# Patient Record
Sex: Female | Born: 1935 | Race: Black or African American | Hispanic: No | Marital: Married | Smoking: Never smoker
Health system: Southern US, Community
[De-identification: ages and names within clinical notes are randomized; demographics above are authoritative.]

## PROBLEM LIST (undated history)

## (undated) DIAGNOSIS — J45909 Unspecified asthma, uncomplicated: Secondary | ICD-10-CM

## (undated) DIAGNOSIS — I10 Essential (primary) hypertension: Secondary | ICD-10-CM

## (undated) DIAGNOSIS — H409 Unspecified glaucoma: Secondary | ICD-10-CM

## (undated) DIAGNOSIS — E785 Hyperlipidemia, unspecified: Secondary | ICD-10-CM

## (undated) DIAGNOSIS — E119 Type 2 diabetes mellitus without complications: Secondary | ICD-10-CM

## (undated) DIAGNOSIS — M1990 Unspecified osteoarthritis, unspecified site: Secondary | ICD-10-CM

## (undated) HISTORY — DX: Unspecified osteoarthritis, unspecified site: M19.90

## (undated) HISTORY — DX: Type 2 diabetes mellitus without complications: E11.9

## (undated) HISTORY — PX: ABDOMINAL HYSTERECTOMY: SHX81

## (undated) HISTORY — DX: Unspecified asthma, uncomplicated: J45.909

## (undated) HISTORY — DX: Essential (primary) hypertension: I10

## (undated) HISTORY — DX: Unspecified glaucoma: H40.9

## (undated) HISTORY — DX: Hyperlipidemia, unspecified: E78.5

---

## 2017-03-14 LAB — HEPATIC FUNCTION PANEL
ALT: 19 (ref 7–35)
AST: 16 (ref 13–35)
Alkaline Phosphatase: 107 (ref 25–125)
Bilirubin, Total: 0.3

## 2017-03-14 LAB — CBC AND DIFFERENTIAL
HCT: 34 — AB (ref 36–46)
Hemoglobin: 11 — AB (ref 12.0–16.0)
Platelets: 293 (ref 150–399)
WBC: 8

## 2017-03-14 LAB — BASIC METABOLIC PANEL
BUN: 26 — AB (ref 4–21)
Creatinine: 1.3 — AB (ref 0.5–1.1)
Glucose: 118

## 2017-07-19 DIAGNOSIS — M1711 Unilateral primary osteoarthritis, right knee: Secondary | ICD-10-CM | POA: Insufficient documentation

## 2017-07-19 DIAGNOSIS — M1712 Unilateral primary osteoarthritis, left knee: Secondary | ICD-10-CM | POA: Insufficient documentation

## 2017-10-17 LAB — HM DIABETES EYE EXAM

## 2017-12-14 ENCOUNTER — Ambulatory Visit (INDEPENDENT_AMBULATORY_CARE_PROVIDER_SITE_OTHER): Payer: Medicare Other | Admitting: Physician Assistant

## 2017-12-14 ENCOUNTER — Other Ambulatory Visit: Payer: Self-pay

## 2017-12-14 ENCOUNTER — Encounter: Payer: Self-pay | Admitting: Physician Assistant

## 2017-12-14 VITALS — BP 128/70 | HR 83 | Temp 98.1°F | Resp 14 | Ht 60.5 in | Wt 171.0 lb

## 2017-12-14 DIAGNOSIS — E1169 Type 2 diabetes mellitus with other specified complication: Secondary | ICD-10-CM | POA: Diagnosis not present

## 2017-12-14 DIAGNOSIS — Z794 Long term (current) use of insulin: Secondary | ICD-10-CM

## 2017-12-14 DIAGNOSIS — I10 Essential (primary) hypertension: Secondary | ICD-10-CM

## 2017-12-14 DIAGNOSIS — E119 Type 2 diabetes mellitus without complications: Secondary | ICD-10-CM | POA: Diagnosis not present

## 2017-12-14 DIAGNOSIS — Z23 Encounter for immunization: Secondary | ICD-10-CM

## 2017-12-14 DIAGNOSIS — E785 Hyperlipidemia, unspecified: Secondary | ICD-10-CM

## 2017-12-14 LAB — COMPREHENSIVE METABOLIC PANEL
ALT: 9 U/L (ref 0–35)
AST: 15 U/L (ref 0–37)
Albumin: 4.3 g/dL (ref 3.5–5.2)
Alkaline Phosphatase: 96 U/L (ref 39–117)
BUN: 35 mg/dL — AB (ref 6–23)
CO2: 26 mEq/L (ref 19–32)
Calcium: 10.1 mg/dL (ref 8.4–10.5)
Chloride: 105 mEq/L (ref 96–112)
Creatinine, Ser: 1.36 mg/dL — ABNORMAL HIGH (ref 0.40–1.20)
GFR: 47.78 mL/min — ABNORMAL LOW (ref >60.00)
Glucose, Bld: 91 mg/dL (ref 70–99)
Potassium: 4.5 mEq/L (ref 3.5–5.1)
SODIUM: 141 meq/L (ref 135–145)
Total Bilirubin: 0.3 mg/dL (ref 0.2–1.2)
Total Protein: 7.4 g/dL (ref 6.0–8.3)

## 2017-12-14 LAB — LIPID PANEL
Cholesterol: 146 mg/dL (ref 0–200)
HDL: 65.4 mg/dL (ref >39.00)
LDL CALC: 67 mg/dL (ref 0–99)
NonHDL: 80.43
Total CHOL/HDL Ratio: 2
Triglycerides: 65 mg/dL (ref 0.0–149.0)
VLDL: 13 mg/dL (ref 0.0–40.0)

## 2017-12-14 LAB — HEMOGLOBIN A1C: Hgb A1c MFr Bld: 7.2 % — ABNORMAL HIGH (ref 4.6–6.5)

## 2017-12-14 MED ORDER — AMLODIPINE BESYLATE 5 MG PO TABS: 5.0000 mg | ORAL_TABLET | Freq: Every day | ORAL | 1 refills | Status: DC

## 2017-12-14 NOTE — Progress Notes (Signed)
Patient presents to clinic today to establish care.  Acute Concerns: Patient notes she is due for her mammogram and bone density scan. Had scheduled right before she moved to [facility].  Chronic Issues: Hypertension -- Is currently on a combination of Diovan HCT and amlodipine. Denies history of stroke or heart attack. Notes history of negative stress test and echocardiogram. Endorses taking medications as directed. Patient denies chest pain, palpitations, lightheadedness, dizziness, vision changes or frequent headaches.  BP Readings from Last 3 Encounters:  12/14/17 128/70   Hyperlipidemia -- Currently taking Atorvastatin 40 mg daily. Notes keeping well-balanced diet. Exercise.  Followed by Cardiology.  Diabetes Mellitus II -- Endorses controlled. Taking medications as directed. Notes she recently had eye examination. Will need to obtain records. Endorses up-to-date on foot exam and pneumonia vaccines. Will need records. Denies acute concerns today. She does believe she is due for A1C. Notes last A1C < 7.   Health Maintenance: Immunizations -- Due for flu shot.  Colonoscopy -- Will obtain records. Up-to-date PAP -- s/p hysterectomy.    Past Medical History:  Diagnosis Date  . Arthritis   . Asthma   . Diabetes mellitus without complication (HCC)   . Glaucoma   . Hyperlipidemia   . Hypertension     Past Surgical History:  Procedure Laterality Date  . ABDOMINAL HYSTERECTOMY      Current Outpatient Medications on File Prior to Visit  Medication Sig Dispense Refill  . aspirin EC 81 MG tablet Take 81 mg by mouth daily.    Marland Kitchen atorvastatin (LIPITOR) 40 MG tablet Take 40 mg by mouth daily.  0  . Cholecalciferol (VITAMIN D3) 125 MCG (5000 UT) CAPS Take 1 capsule by mouth daily.    . Fluticasone-Salmeterol (ADVAIR DISKUS) 250-50 MCG/DOSE AEPB Inhale 1 puff into the lungs 2 (two) times daily.    . furosemide (LASIX) 20 MG tablet Take 20 mg by mouth as needed.    Marland Kitchen ibuprofen  (ADVIL,MOTRIN) 800 MG tablet Take 800 mg by mouth every 8 (eight) hours as needed.    . Insulin Glargine (LANTUS SOLOSTAR) 100 UNIT/ML Solostar Pen Inject 30 Units into the skin daily.    . meloxicam (MOBIC) 15 MG tablet Take 15 mg by mouth daily.  4  . metFORMIN (GLUCOPHAGE) 1000 MG tablet Take 1,000 mg by mouth 2 (two) times daily.  11  . methocarbamol (ROBAXIN) 750 MG tablet Take 1 tablet by mouth 2 (two) times daily.    . valsartan-hydrochlorothiazide (DIOVAN HCT) 160-25 MG tablet Take 1 tablet by mouth daily.     No current facility-administered medications on file prior to visit.     No Known Allergies  Family History  Problem Relation Age of Onset  . Diabetes Mother   . Cancer Father   . Arthritis Sister   . Diabetes Sister   . Heart disease Sister   . Arthritis Brother   . Arthritis Sister   . Arthritis Sister   . Hearing loss Sister   . Arthritis Brother   . Heart disease Brother     Social History   Socioeconomic History  . Marital status: Married    Spouse name: Not on file  . Number of children: Not on file  . Years of education: Not on file  . Highest education level: Not on file  Occupational History  . Not on file  Social Needs  . Financial resource strain: Not on file  . Food insecurity:    Worry: Not on file  Inability: Not on file  . Transportation needs:    Medical: Not on file    Non-medical: Not on file  Tobacco Use  . Smoking status: Never Smoker  . Smokeless tobacco: Never Used  Substance and Sexual Activity  . Alcohol use: Not Currently  . Drug use: Not Currently  . Sexual activity: Not on file  Lifestyle  . Physical activity:    Days per week: Not on file    Minutes per session: Not on file  . Stress: Not on file  Relationships  . Social connections:    Talks on phone: Not on file    Gets together: Not on file    Attends religious service: Not on file    Active member of club or organization: Not on file    Attends meetings of  clubs or organizations: Not on file    Relationship status: Not on file  . Intimate partner violence:    Fear of current or ex partner: Not on file    Emotionally abused: Not on file    Physically abused: Not on file    Forced sexual activity: Not on file  Other Topics Concern  . Not on file  Social History Narrative  . Not on file   Review of Systems  Constitutional: Negative for fever, malaise/fatigue and weight loss.  HENT: Negative for hearing loss.   Eyes: Negative for blurred vision and double vision.  Respiratory: Negative for cough and shortness of breath.   Cardiovascular: Negative for chest pain and palpitations.  Neurological: Negative for dizziness, loss of consciousness and headaches.  Psychiatric/Behavioral: Negative for depression and suicidal ideas. The patient is not nervous/anxious and does not have insomnia.    BP 128/70   Pulse 83   Temp 98.1 F (36.7 C) (Oral)   Resp 14   Ht 5' 0.5" (1.537 m)   Wt 171 lb (77.6 kg)   SpO2 98%   BMI 32.85 kg/m   Physical Exam  Constitutional: She is oriented to person, place, and time. She appears well-developed and well-nourished.  HENT:  Head: Normocephalic and atraumatic.  Right Ear: External ear normal.  Left Ear: External ear normal.  Nose: Nose normal.  Mouth/Throat: Oropharynx is clear and moist.  Eyes: Conjunctivae are normal.  Neck: Neck supple.  Cardiovascular: Normal rate, regular rhythm, normal heart sounds and intact distal pulses.  Pulmonary/Chest: Effort normal.  Neurological: She is alert and oriented to person, place, and time.  Psychiatric: She has a normal mood and affect.  Vitals reviewed.  Assessment/Plan: Essential hypertension BP normotensive. Asymptomatic. Will check labs today. Continue current regimen. Medications refilled. Will plan on follow-up every 6 months.   Controlled type 2 diabetes mellitus without complication, with long-term current use of insulin (HCC) Will obtain records of  eye and foot examinations as well as immunization records for review. No noted history of retinopathy or neuropathy. Will check renal function today. Is on ARB therapy and Statin. Will have her continue medication regimen. Labs today to reassess renal function, A1C and lipids. High-dose flu vaccination given today.  Hyperlipidemia associated with type 2 diabetes mellitus (HCC) On statin therapy. Dietary and exercise recommendations reviewed. Will check labs today.     Piedad ClimesWilliam Cody Marceil Welp, PA-C

## 2017-12-14 NOTE — Patient Instructions (Signed)
Please go to the lab today for blood work.  I will call you with your results. We will alter treatment regimen(s) if indicated by your results.   Continue medications as directed.  You will be contacted for assessment for Mammogram and Bone Density scan.  Your flu shot was updated today.  We still do not have records on you. Please speak with the ladies at the front desk so they can send over a request.  I will review and call you.   It was very nice meeting you today. Welcome to [facility]!

## 2017-12-16 DIAGNOSIS — I10 Essential (primary) hypertension: Secondary | ICD-10-CM | POA: Insufficient documentation

## 2017-12-16 DIAGNOSIS — E119 Type 2 diabetes mellitus without complications: Principal | ICD-10-CM

## 2017-12-16 DIAGNOSIS — Z794 Long term (current) use of insulin: Principal | ICD-10-CM

## 2017-12-16 DIAGNOSIS — E1169 Type 2 diabetes mellitus with other specified complication: Secondary | ICD-10-CM | POA: Insufficient documentation

## 2017-12-16 DIAGNOSIS — E785 Hyperlipidemia, unspecified: Secondary | ICD-10-CM

## 2017-12-16 DIAGNOSIS — Z23 Encounter for immunization: Secondary | ICD-10-CM | POA: Insufficient documentation

## 2017-12-16 NOTE — Assessment & Plan Note (Signed)
Will obtain records of eye and foot examinations as well as immunization records for review. No noted history of retinopathy or neuropathy. Will check renal function today. Is on ARB therapy and Statin. Will have her continue medication regimen. Labs today to reassess renal function, A1C and lipids. High-dose flu vaccination given today.

## 2017-12-16 NOTE — Assessment & Plan Note (Signed)
BP normotensive. Asymptomatic. Will check labs today. Continue current regimen. Medications refilled. Will plan on follow-up every 6 months.

## 2017-12-16 NOTE — Assessment & Plan Note (Signed)
On statin therapy. Dietary and exercise recommendations reviewed. Will check labs today.

## 2017-12-18 ENCOUNTER — Other Ambulatory Visit: Payer: Self-pay | Admitting: Physician Assistant

## 2017-12-18 DIAGNOSIS — R7989 Other specified abnormal findings of blood chemistry: Secondary | ICD-10-CM

## 2017-12-27 ENCOUNTER — Other Ambulatory Visit: Payer: Self-pay | Admitting: Physician Assistant

## 2017-12-27 NOTE — Telephone Encounter (Signed)
Copied from CRM [id number]. Topic: Quick Communication - Rx Refill/Question >> Dec 27, 2017  1:15 PM Jaquita Rectoravis, Karen A wrote: Medication: meloxicam (MOBIC) 15 MG tablet  Has the patient contacted their pharmacy? Yes.   (Agent: If no, request that the patient contact the pharmacy for the refill.) (Agent: If yes, when and what did the pharmacy advise?)  Preferred Pharmacy (with phone number or street name): [id number] - [place], [place] - [id number] [phone] (Phone) [phone] (Fax)    Agent: Please be advised that RX refills may take up to 3 business days. We ask that you follow-up with your pharmacy.

## 2017-12-27 NOTE — Telephone Encounter (Signed)
Requested medication (s) are due for refill today:unknown  Requested medication (s) are on the active medication list: yes  Last refill:  12/14/17  Future visit scheduled: no  Notes to clinic:  Historical provider   Requested Prescriptions  Pending Prescriptions Disp Refills   meloxicam (MOBIC) 15 MG tablet  4    Sig: Take 1 tablet (15 mg total) by mouth daily.     Analgesics:  COX2 Inhibitors Failed - 12/27/2017  1:57 PM      Failed - HGB in normal range and within 360 days    No results found for: HGB, HGBKUC, HGBPOCKUC       Failed - Cr in normal range and within 360 days    Creatinine, Ser  Date Value Ref Range Status  12/14/2017 1.36 (H) 0.40 - 1.20 mg/dL Final         Passed - Patient is not pregnant      Passed - Valid encounter within last 12 months    Recent Outpatient Visits          1 week ago Controlled type 2 diabetes mellitus without complication, with long-term current use of insulin [facility](HCC)   [facility] [place], [place]

## 2018-01-23 ENCOUNTER — Telehealth: Payer: Self-pay

## 2018-01-23 NOTE — Telephone Encounter (Signed)
-----   Message from Waldon MerlWilliam C Martin, PA-C sent at 01/23/2018  8:06 AM EST ----- Please make sure that patient schedules her repeat BMP. Is overdue.  ----- Message ----- From: SYSTEM Sent: 01/23/2018  12:07 AM EST To: Waldon MerlWilliam C Martin, PA-C

## 2018-01-23 NOTE — Telephone Encounter (Signed)
Spoke with patient on the phone, stated she will call back and schedule appt when she has a ride to bring her here.

## 2018-01-25 ENCOUNTER — Encounter: Payer: Self-pay | Admitting: Physician Assistant

## 2018-01-25 ENCOUNTER — Ambulatory Visit (INDEPENDENT_AMBULATORY_CARE_PROVIDER_SITE_OTHER): Payer: Medicare Other | Admitting: Physician Assistant

## 2018-01-25 ENCOUNTER — Other Ambulatory Visit: Payer: Self-pay

## 2018-01-25 VITALS — BP 132/70 | HR 71 | Temp 98.2°F | Resp 16 | Ht 61.0 in | Wt 170.0 lb

## 2018-01-25 DIAGNOSIS — J208 Acute bronchitis due to other specified organisms: Secondary | ICD-10-CM | POA: Diagnosis not present

## 2018-01-25 DIAGNOSIS — E785 Hyperlipidemia, unspecified: Secondary | ICD-10-CM | POA: Diagnosis not present

## 2018-01-25 DIAGNOSIS — E1169 Type 2 diabetes mellitus with other specified complication: Secondary | ICD-10-CM

## 2018-01-25 MED ORDER — HYDROCOD POLST-CPM POLST ER 10-8 MG/5ML PO SUER: 5.0000 mL | Freq: Two times a day (BID) | ORAL | 0 refills | Status: DC | PRN

## 2018-01-25 MED ORDER — ATORVASTATIN CALCIUM 40 MG PO TABS: 40.0000 mg | ORAL_TABLET | Freq: Every day | ORAL | 1 refills | Status: DC

## 2018-01-25 NOTE — Patient Instructions (Addendum)
Please keep well-hydrated and get plenty of rest. I am glad symptoms are mostly resolved. The cough should continue to improve as well, but the cough syrup will also help. Do not take cough medication and drive.  Follow-up if symptoms are not continuing to resolve.

## 2018-01-25 NOTE — Progress Notes (Signed)
Patient presents to clinic today c/o 1 week of dry cough, some nasal congestion with slightly yellow discharge that is resolving.Patient denies fever, chills, dizziness, lightheadedness, N/V/D/C, sinus pain, Headaches, palpitations.  Past Medical History:  Diagnosis Date  . Arthritis   . Asthma   . Diabetes mellitus without complication ([facility])   . Glaucoma   . Hyperlipidemia   . Hypertension     Current Outpatient Medications on File Prior to Visit  Medication Sig Dispense Refill  . amLODipine (NORVASC) 5 MG tablet Take 1 tablet (5 mg total) by mouth daily. 90 tablet 1  . aspirin EC 81 MG tablet Take 81 mg by mouth daily.    . Cholecalciferol (VITAMIN D3) 125 MCG (5000 UT) CAPS Take 1 capsule by mouth daily.    . Fluticasone-Salmeterol (ADVAIR DISKUS) 250-50 MCG/DOSE AEPB Inhale 1 puff into the lungs 2 (two) times daily.    . furosemide (LASIX) 20 MG tablet Take 20 mg by mouth as needed.    Marland Kitchen ibuprofen (ADVIL,MOTRIN) 800 MG tablet Take 800 mg by mouth every 8 (eight) hours as needed.    . Insulin Glargine (LANTUS SOLOSTAR) 100 UNIT/ML Solostar Pen Inject 30 Units into the skin daily.    . meloxicam (MOBIC) 15 MG tablet Take 15 mg by mouth daily.  4  . metFORMIN (GLUCOPHAGE) 1000 MG tablet Take 1,000 mg by mouth 2 (two) times daily.  11  . methocarbamol (ROBAXIN) 750 MG tablet Take 1 tablet by mouth 2 (two) times daily.    . valsartan-hydrochlorothiazide (DIOVAN HCT) 160-25 MG tablet Take 1 tablet by mouth daily.     No current facility-administered medications on file prior to visit.     No Known Allergies  Family History  Problem Relation Age of Onset  . Diabetes Mother   . Cancer Father   . Arthritis Sister   . Diabetes Sister   . Heart disease Sister   . Arthritis Brother   . Arthritis Sister   . Arthritis Sister   . Hearing loss Sister   . Arthritis Brother   . Heart disease Brother     Social History   Socioeconomic History  . Marital status: Married   Spouse name: Not on file  . Number of children: Not on file  . Years of education: Not on file  . Highest education level: Not on file  Occupational History  . Not on file  Social Needs  . Financial resource strain: Not on file  . Food insecurity:    Worry: Not on file    Inability: Not on file  . Transportation needs:    Medical: Not on file    Non-medical: Not on file  Tobacco Use  . Smoking status: Never Smoker  . Smokeless tobacco: Never Used  Substance and Sexual Activity  . Alcohol use: Not Currently  . Drug use: Not Currently  . Sexual activity: Not on file  Lifestyle  . Physical activity:    Days per week: Not on file    Minutes per session: Not on file  . Stress: Not on file  Relationships  . Social connections:    Talks on phone: Not on file    Gets together: Not on file    Attends religious service: Not on file    Active member of club or organization: Not on file    Attends meetings of clubs or organizations: Not on file    Relationship status: Not on file  Other Topics Concern  .  Not on file  Social History Narrative  . Not on file   Review of Systems - See HPI.  All other ROS are negative.  BP 132/70   Pulse 71   Temp 98.2 F (36.8 C) (Oral)   Resp 16   Ht '5\' 1"'  (1.549 m)   Wt 170 lb (77.1 kg)   SpO2 97%   BMI 32.12 kg/m   Physical Exam Constitutional:      Appearance: Normal appearance.  HENT:     Head: Normocephalic.     Mouth/Throat:     Mouth: Mucous membranes are moist.     Pharynx: Posterior oropharyngeal erythema present.  Neck:     Musculoskeletal: Normal range of motion and neck supple.  Neurological:     Mental Status: She is alert.    Recent Results (from the past 2160 hour(s))  Hemoglobin A1c     Status: Abnormal   Collection Time: 12/14/17  2:30 PM  Result Value Ref Range   Hgb A1c MFr Bld 7.2 (H) 4.6 - 6.5 %    Comment: Glycemic Control Guidelines for People with Diabetes:Non Diabetic:  <6%Goal of Therapy:  <7%Additional Action Suggested:  >8%   Comp Met (CMET)     Status: Abnormal   Collection Time: 12/14/17  2:30 PM  Result Value Ref Range   Sodium 141 135 - 145 mEq/L   Potassium 4.5 3.5 - 5.1 mEq/L   Chloride 105 96 - 112 mEq/L   CO2 26 19 - 32 mEq/L   Glucose, Bld 91 70 - 99 mg/dL   BUN 35 (H) 6 - 23 mg/dL   Creatinine, Ser 1.36 (H) 0.40 - 1.20 mg/dL   Total Bilirubin 0.3 0.2 - 1.2 mg/dL   Alkaline Phosphatase 96 39 - 117 U/L   AST 15 0 - 37 U/L   ALT 9 0 - 35 U/L   Total Protein 7.4 6.0 - 8.3 g/dL   Albumin 4.3 3.5 - 5.2 g/dL   Calcium 10.1 8.4 - 10.5 mg/dL   GFR 47.78 (L) >60.00 mL/min  Lipid Profile     Status: None   Collection Time: 12/14/17  2:30 PM  Result Value Ref Range   Cholesterol 146 0 - 200 mg/dL    Comment: ATP III Classification       Desirable:  < 200 mg/dL               Borderline High:  200 - 239 mg/dL          High:  > = 240 mg/dL   Triglycerides 65.0 0.0 - 149.0 mg/dL    Comment: Normal:  <150 mg/dLBorderline High:  150 - 199 mg/dL   HDL 65.40 >39.00 mg/dL   VLDL 13.0 0.0 - 40.0 mg/dL   LDL Cholesterol 67 0 - 99 mg/dL   Total CHOL/HDL Ratio 2     Comment:                Men          Women1/2 Average Risk     3.4          3.3Average Risk          5.0          4.42X Average Risk          9.6          7.13X Average Risk          15.0  11.0                       NonHDL 80.43     Comment: NOTE:  Non-HDL goal should be 30 mg/dL higher than patient's LDL goal (i.e. LDL goal of < 70 mg/dL, would have non-HDL goal of < 100 mg/dL)    Assessment/Plan: 1. Hyperlipidemia associated with type 2 diabetes mellitus (HCC) Medications refilled. Continue current regimen. - atorvastatin (LIPITOR) 40 MG tablet; Take 1 tablet (40 mg total) by mouth daily.  Dispense: 90 tablet; Refill: 1  2. Viral bronchitis Symptoms resolving. Still with pesky cough. Lungs CTAB. Supportive measures and OTC medications reviewed. Rx Tussionex.     Leeanne Rio, PA-C

## 2018-01-30 ENCOUNTER — Encounter: Payer: Self-pay | Admitting: Physician Assistant

## 2018-02-15 ENCOUNTER — Other Ambulatory Visit: Payer: Self-pay | Admitting: Physician Assistant

## 2018-02-15 NOTE — Telephone Encounter (Signed)
Copied from CRM [id number]. Topic: Quick Communication - Rx Refill/Question >> Feb 15, 2018 11:12 AM Baldo Daub L wrote: Medication: chlorpheniramine-HYDROcodone Stevphen Meuse PENNKINETIC ER) 10-8 MG/5ML SUER  Has the patient contacted their pharmacy? Yes - states she must have new script (Agent: If no, request that the patient contact the pharmacy for the refill.) (Agent: If yes, when and what did the pharmacy advise?)  Preferred Pharmacy (with phone number or street name): [id number] - [place], [place] - [address] [phone] (Phone) [phone] (Fax)  Agent: Please be advised that RX refills may take up to 3 business days. We ask that you follow-up with your pharmacy.

## 2018-02-15 NOTE — Telephone Encounter (Signed)
Requested medication (s) are due for refill today -yes  Requested medication (s) are on the active medication list -yes  Future visit scheduled -no  Last refill: 01/25/18  Notes to clinic: Patient is requesting refill of cough medication- she will need new Rx as it was for acute problem  Requested Prescriptions  Pending Prescriptions Disp Refills   chlorpheniramine-HYDROcodone (TUSSIONEX PENNKINETIC ER) 10-8 MG/5ML SUER 140 mL 0    Sig: Take 5 mLs by mouth every 12 (twelve) hours as needed.     Off-Protocol Failed - 02/15/2018 11:22 AM      Failed - Medication not assigned to a protocol, review manually.      Passed - Valid encounter within last 12 months    Recent Outpatient Visits          3 weeks ago Viral bronchitis   [facility] [place], [place], [place]   2 months ago Controlled type 2 diabetes mellitus without complication, with long-term current use of insulin [facility])   [facility] [place], [place], [place]              Requested Prescriptions  Pending Prescriptions Disp Refills   chlorpheniramine-HYDROcodone (TUSSIONEX PENNKINETIC ER) 10-8 MG/5ML SUER 140 mL 0    Sig: Take 5 mLs by mouth every 12 (twelve) hours as needed.     Off-Protocol Failed - 02/15/2018 11:22 AM      Failed - Medication not assigned to a protocol, review manually.      Passed - Valid encounter within last 12 months    Recent Outpatient Visits          3 weeks ago Viral bronchitis   [facility] [place], [place], [place]   2 months ago Controlled type 2 diabetes mellitus without complication, with long-term current use of insulin [facility])   [facility] [place], [place], [place]

## 2018-02-18 ENCOUNTER — Ambulatory Visit (INDEPENDENT_AMBULATORY_CARE_PROVIDER_SITE_OTHER): Payer: Medicare Other | Admitting: Physician Assistant

## 2018-02-18 ENCOUNTER — Other Ambulatory Visit: Payer: Self-pay

## 2018-02-18 ENCOUNTER — Encounter: Payer: Self-pay | Admitting: Physician Assistant

## 2018-02-18 VITALS — BP 128/62 | HR 90 | Temp 98.8°F | Resp 14 | Ht 61.0 in | Wt 170.0 lb

## 2018-02-18 DIAGNOSIS — J208 Acute bronchitis due to other specified organisms: Secondary | ICD-10-CM

## 2018-02-18 DIAGNOSIS — B9689 Other specified bacterial agents as the cause of diseases classified elsewhere: Secondary | ICD-10-CM

## 2018-02-18 MED ORDER — AMOXICILLIN-POT CLAVULANATE 875-125 MG PO TABS: 1.0000 | ORAL_TABLET | Freq: Two times a day (BID) | ORAL | 0 refills | Status: DC

## 2018-02-18 MED ORDER — HYDROCOD POLST-CPM POLST ER 10-8 MG/5ML PO SUER: 5.0000 mL | Freq: Two times a day (BID) | ORAL | 0 refills | Status: DC | PRN

## 2018-02-18 NOTE — Progress Notes (Signed)
Acute Office Visit  Subjective:    Patient ID: Lorraine Alexander, female    DOB: Oct 26, 1933, 85 y.o.   MRN: [id number]  Chief Complaint  Patient presents with  . Cough    HPI Patient is in today for 2+ weeks of URI symptoms. Endorses started about two weeks ago as a dry cough, now is productive with yellowish-green sputum and yellow nasal discharge.  Cough medicine from previous visit was helping.  Nasal congestion, insomnia d/t coughing. Pt denies SOB, wheezing, nausea, vomiting, diarrhea, fever, chills body aches, chest pain or tightness, neck stiffness or pain     Past Medical History:  Diagnosis Date  . Arthritis   . Asthma   . Diabetes mellitus without complication (HCC)   . Glaucoma   . Hyperlipidemia   . Hypertension     Past Surgical History:  Procedure Laterality Date  . ABDOMINAL HYSTERECTOMY      Family History  Problem Relation Age of Onset  . Diabetes Mother   . Cancer Father   . Arthritis Sister   . Diabetes Sister   . Heart disease Sister   . Arthritis Brother   . Arthritis Sister   . Arthritis Sister   . Hearing loss Sister   . Arthritis Brother   . Heart disease Brother     Social History   Socioeconomic History  . Marital status: Married    Spouse name: Not on file  . Number of children: Not on file  . Years of education: Not on file  . Highest education level: Not on file  Occupational History  . Not on file  Social Needs  . Financial resource strain: Not on file  . Food insecurity:    Worry: Not on file    Inability: Not on file  . Transportation needs:    Medical: Not on file    Non-medical: Not on file  Tobacco Use  . Smoking status: Never Smoker  . Smokeless tobacco: Never Used  Substance and Sexual Activity  . Alcohol use: Not Currently  . Drug use: Not Currently  . Sexual activity: Not on file  Lifestyle  . Physical activity:    Days per week: Not on file    Minutes per session: Not on file  . Stress: Not on file    Relationships  . Social connections:    Talks on phone: Not on file    Gets together: Not on file    Attends religious service: Not on file    Active member of club or organization: Not on file    Attends meetings of clubs or organizations: Not on file    Relationship status: Not on file  . Intimate partner violence:    Fear of current or ex partner: Not on file    Emotionally abused: Not on file    Physically abused: Not on file    Forced sexual activity: Not on file  Other Topics Concern  . Not on file  Social History Narrative  . Not on file    Outpatient Medications Prior to Visit  Medication Sig Dispense Refill  . amLODipine (NORVASC) 5 MG tablet Take 1 tablet (5 mg total) by mouth daily. 90 tablet 1  . aspirin EC 81 MG tablet Take 81 mg by mouth daily.    Marland Kitchen atorvastatin (LIPITOR) 40 MG tablet Take 1 tablet (40 mg total) by mouth daily. 90 tablet 1  . Cholecalciferol (VITAMIN D3) 125 MCG (5000 UT) CAPS Take 1 capsule  by mouth daily.    . Fluticasone-Salmeterol (ADVAIR DISKUS) 250-50 MCG/DOSE AEPB Inhale 1 puff into the lungs 2 (two) times daily.    . furosemide (LASIX) 20 MG tablet Take 20 mg by mouth as needed.    Marland Kitchen ibuprofen (ADVIL,MOTRIN) 800 MG tablet Take 800 mg by mouth every 8 (eight) hours as needed.    . Insulin Glargine (LANTUS SOLOSTAR) 100 UNIT/ML Solostar Pen Inject 30 Units into the skin daily.    . meloxicam (MOBIC) 15 MG tablet Take 15 mg by mouth daily.  4  . metFORMIN (GLUCOPHAGE) 1000 MG tablet Take 1,000 mg by mouth 2 (two) times daily.  11  . methocarbamol (ROBAXIN) 750 MG tablet Take 1 tablet by mouth 2 (two) times daily.    . valsartan-hydrochlorothiazide (DIOVAN HCT) 160-25 MG tablet Take 1 tablet by mouth daily.    . chlorpheniramine-HYDROcodone (TUSSIONEX PENNKINETIC ER) 10-8 MG/5ML SUER Take 5 mLs by mouth every 12 (twelve) hours as needed. (Patient not taking: Reported on 02/18/2018) 140 mL 0   No facility-administered medications prior to visit.     No Known Allergies  ROS Pertinent ROS are listed in the HPI.    Objective:    Physical Exam  Constitutional: Vital signs are normal. She appears well-developed and well-nourished.  HENT:  Head: Normocephalic and atraumatic.  Right Ear: Hearing, external ear and ear canal normal.  Left Ear: Hearing, external ear and ear canal normal.  Mouth/Throat: Uvula is midline. Posterior oropharyngeal erythema present.  Cardiovascular: Normal rate, regular rhythm and normal heart sounds.  Pulmonary/Chest: Effort normal and breath sounds normal. She has no wheezes. She has no rhonchi. She has no rales.    BP 128/62   Pulse 90   Temp 98.8 F (37.1 C) (Oral)   Resp 14   Ht 5\' 1"  (1.549 m)   Wt 170 lb (77.1 kg)   SpO2 96%   BMI 32.12 kg/m  Wt Readings from Last 3 Encounters:  02/18/18 170 lb (77.1 kg)  01/25/18 170 lb (77.1 kg)  12/14/17 171 lb (77.6 kg)    Health Maintenance Due  Topic Date Due  . FOOT EXAM  02/19/1945  . DTaP/Tdap/Td (1 - Tdap) 02/19/1946  . TETANUS/TDAP  02/19/1954  . DEXA SCAN  02/20/2000  . PNA vac Low Risk Adult (1 of 2 - PCV13) 02/20/2000    There are no preventive care reminders to display for this patient.   No results found for: TSH Lab Results  Component Value Date   WBC 8.0 03/14/2017   HGB 11.0 (A) 03/14/2017   HCT 34 (A) 03/14/2017   PLT 293 03/14/2017   Lab Results  Component Value Date   NA 141 12/14/2017   K 4.5 12/14/2017   CO2 26 12/14/2017   GLUCOSE 91 12/14/2017   BUN 35 (H) 12/14/2017   CREATININE 1.36 (H) 12/14/2017   BILITOT 0.3 12/14/2017   ALKPHOS 96 12/14/2017   AST 15 12/14/2017   ALT 9 12/14/2017   PROT 7.4 12/14/2017   ALBUMIN 4.3 12/14/2017   CALCIUM 10.1 12/14/2017   GFR 47.78 (L) 12/14/2017   Lab Results  Component Value Date   CHOL 146 12/14/2017   Lab Results  Component Value Date   HDL 65.40 12/14/2017   Lab Results  Component Value Date   LDLCALC 67 12/14/2017   Lab Results  Component Value  Date   TRIG 65.0 12/14/2017   Lab Results  Component Value Date   CHOLHDL 2 12/14/2017  Lab Results  Component Value Date   HGBA1C 7.2 (H) 12/14/2017       Assessment & Plan:   1. Acute bacterial bronchitis Rx Augmentin.  Increase fluids.  Rest.  Saline nasal spray.  Probiotic.  Mucinex as directed.  Humidifier in bedroom. Tussionex refilled.  Call or return to clinic if symptoms are not improving.  - amoxicillin-clavulanate (AUGMENTIN) 875-125 MG tablet; Take 1 tablet by mouth 2 (two) times daily.  Dispense: 14 tablet; Refill: 0   Piedad ClimesWilliam Cody Lorrin Bodner, PA-C

## 2018-02-18 NOTE — Patient Instructions (Signed)
Take antibiotic (Augmentin) as directed.  Increase fluids.  Get plenty of rest. Use Mucinex for congestion. Use the cough medication as directed. Take a daily probiotic (I recommend Align or Culturelle, but even Activia Yogurt may be beneficial).  A humidifier placed in the bedroom may offer some relief for a dry, scratchy throat of nasal irritation.  Read information below on acute bronchitis. Please call or return to clinic if symptoms are not improving.  Acute Bronchitis Bronchitis is when the airways that extend from the windpipe into the lungs get red, puffy, and painful (inflamed). Bronchitis often causes thick spit (mucus) to develop. This leads to a cough. A cough is the most common symptom of bronchitis. In acute bronchitis, the condition usually begins suddenly and goes away over time (usually in 2 weeks). Smoking, allergies, and asthma can make bronchitis worse. Repeated episodes of bronchitis may cause more lung problems.  HOME CARE  Rest.  Drink enough fluids to keep your pee (urine) clear or pale yellow (unless you need to limit fluids as told by your doctor).  Only take over-the-counter or prescription medicines as told by your doctor.  Avoid smoking and secondhand smoke. These can make bronchitis worse. If you are a smoker, think about using nicotine gum or skin patches. Quitting smoking will help your lungs heal faster.  Reduce the chance of getting bronchitis again by:  Washing your hands often.  Avoiding people with cold symptoms.  Trying not to touch your hands to your mouth, nose, or eyes.  Follow up with your doctor as told.  GET HELP IF: Your symptoms do not improve after 1 week of treatment. Symptoms include:  Cough.  Fever.  Coughing up thick spit.  Body aches.  Chest congestion.  Chills.  Shortness of breath.  Sore throat.  GET HELP RIGHT AWAY IF:   You have an increased fever.  You have chills.  You have severe shortness of breath.  You  have bloody thick spit (sputum).  You throw up (vomit) often.  You lose too much body fluid (dehydration).  You have a severe headache.  You faint.  MAKE SURE YOU:   Understand these instructions.  Will watch your condition.  Will get help right away if you are not doing well or get worse. Document Released: 06/14/2007 Document Revised: 08/28/2012 Document Reviewed: 06/18/2012 [facility] Patient Information 2015 [place], [place]. This information is not intended to replace advice given to you by your health care provider. Make sure you discuss any questions you have with your health care provider.

## 2018-03-01 ENCOUNTER — Telehealth: Payer: Self-pay | Admitting: Emergency Medicine

## 2018-03-01 DIAGNOSIS — J208 Acute bronchitis due to other specified organisms: Principal | ICD-10-CM

## 2018-03-01 DIAGNOSIS — B9689 Other specified bacterial agents as the cause of diseases classified elsewhere: Secondary | ICD-10-CM

## 2018-03-01 MED ORDER — BENZONATATE 100 MG PO CAPS: 100.0000 mg | ORAL_CAPSULE | Freq: Three times a day (TID) | ORAL | 0 refills | Status: DC | PRN

## 2018-03-01 NOTE — Telephone Encounter (Signed)
Spoke with patient and advised the cough can last up to 4 weeks or more. She is ok with changing to  [facility], Rx sent to the pharmacy

## 2018-03-01 NOTE — Telephone Encounter (Signed)
Cough unfortunately can last for 4+ weeks. We can either refill the Tussionex or have her try Tessalon over the weekend (100 mg capsule TID PRN - Quant 30 with 0 refills). Continue other measures discussed at her visit.

## 2018-03-01 NOTE — Telephone Encounter (Signed)
Please call to assess current symptoms. Can consider refill of cough syrup but if symptoms not improved at all with antibiotic, she will need reassessment.

## 2018-03-01 NOTE — Telephone Encounter (Signed)
Spoke with patient and she states the cough is persistent, less mucus production, spasms and some sob No wheezing, no fever, no nasal congestion She is using her Advair as prescribed The cough syrup helps the cough but does not improve. Cough still keeping her up at night  Please advise  Copied from CRM [id number]. Topic: General - Other >> Mar 01, 2018  2:58 PM Percival Spanish wrote:  Pt call to req a refill on the below med, she is aware that she may need an appt     amoxicillin-clavulanate (AUGMENTIN) 875-125 MG tablet    chlorpheniramine-HYDROcodone (TUSSIONEX PENNKINETIC ER) 10-8 MG/5ML SUER

## 2018-03-22 ENCOUNTER — Other Ambulatory Visit: Payer: Self-pay | Admitting: Physician Assistant

## 2018-03-22 NOTE — Telephone Encounter (Signed)
Copied from CRM [id number]. Topic: Quick Communication - Rx Refill/Question >> Mar 22, 2018  4:14 PM Baldo Daub L wrote: Medication: methocarbamol (ROBAXIN) 750 MG tablet  Has the patient contacted their pharmacy? Yes - needs new script and need a 90 day supply (Agent: If no, request that the patient contact the pharmacy for the refill.) (Agent: If yes, when and what did the pharmacy advise?)  Preferred Pharmacy (with phone number or street name):   [facility] [place], [place] - [address] AT Portal to [facility]istered [facility] Sites [phone] (Phone) [phone] (Fax)  Agent: Please be advised that RX refills may take up to 3 business days. We ask that you follow-up with your pharmacy.

## 2018-03-25 ENCOUNTER — Other Ambulatory Visit: Payer: Self-pay | Admitting: Emergency Medicine

## 2018-03-25 MED ORDER — METHOCARBAMOL 750 MG PO TABS: 750.0000 mg | ORAL_TABLET | Freq: Two times a day (BID) | ORAL | 0 refills | Status: DC

## 2018-04-25 ENCOUNTER — Other Ambulatory Visit: Payer: Self-pay

## 2018-04-25 ENCOUNTER — Telehealth: Payer: Self-pay | Admitting: Physician Assistant

## 2018-04-25 MED ORDER — METHOCARBAMOL 750 MG PO TABS: 750.0000 mg | ORAL_TABLET | Freq: Two times a day (BID) | ORAL | 0 refills | Status: DC

## 2018-04-25 NOTE — Telephone Encounter (Signed)
Copied from CRM [id number]. Topic: Quick Communication - Rx Refill/Question >> Apr 25, 2018  3:54 PM Reggie Pile, NT wrote: Medication:  methocarbamol (ROBAXIN) 750 MG tablet  Has the patient contacted their pharmacy?  Yes no refills available. Patient is requesting a 90 day supply, due to it being better for her.   Preferred Pharmacy (with phone number or street name):  [id number] - [place], [place] - [address] [phone] (Phone) [phone] (Fax)  Agent: Please be advised that RX refills may take up to 3 business days. We ask that you follow-up with your pharmacy.

## 2018-04-25 NOTE — Telephone Encounter (Signed)
Refill has been sent.  °

## 2018-04-30 ENCOUNTER — Other Ambulatory Visit: Payer: Self-pay | Admitting: Physician Assistant

## 2018-04-30 DIAGNOSIS — G8929 Other chronic pain: Secondary | ICD-10-CM

## 2018-04-30 DIAGNOSIS — M545 Low back pain: Principal | ICD-10-CM

## 2018-04-30 MED ORDER — MELOXICAM 15 MG PO TABS: 15.0000 mg | ORAL_TABLET | Freq: Every day | ORAL | 1 refills | Status: DC

## 2018-05-02 ENCOUNTER — Other Ambulatory Visit: Payer: Self-pay | Admitting: Physician Assistant

## 2018-05-02 DIAGNOSIS — G8929 Other chronic pain: Secondary | ICD-10-CM

## 2018-05-02 DIAGNOSIS — M545 Low back pain: Principal | ICD-10-CM

## 2018-05-06 ENCOUNTER — Other Ambulatory Visit: Payer: Self-pay | Admitting: Physician Assistant

## 2018-05-06 DIAGNOSIS — Z794 Long term (current) use of insulin: Principal | ICD-10-CM

## 2018-05-06 DIAGNOSIS — E119 Type 2 diabetes mellitus without complications: Secondary | ICD-10-CM

## 2018-05-06 DIAGNOSIS — Z1231 Encounter for screening mammogram for malignant neoplasm of breast: Secondary | ICD-10-CM

## 2018-05-07 ENCOUNTER — Telehealth: Payer: Self-pay

## 2018-05-07 NOTE — Telephone Encounter (Signed)
Referral placed yesterday to Dr Everardo All.

## 2018-05-07 NOTE — Telephone Encounter (Signed)
Copied from CRM [id number]. Topic: Referral - Request for Referral >> May 06, 2018  2:04 PM Louie Bun, Rosey Bath D wrote: Has patient seen PCP for this complaint? No *If NO, is insurance requiring patient see PCP for this issue before PCP can refer them? Referral for which specialty: endocrinology Preferred provider/office: Dr. Everardo All Reason for referral: Patient needs new referral due to she needs to establish care.

## 2018-05-24 ENCOUNTER — Other Ambulatory Visit: Payer: Self-pay

## 2018-05-28 ENCOUNTER — Other Ambulatory Visit: Payer: Self-pay

## 2018-05-28 ENCOUNTER — Encounter: Payer: Self-pay | Admitting: Endocrinology

## 2018-05-28 ENCOUNTER — Ambulatory Visit (INDEPENDENT_AMBULATORY_CARE_PROVIDER_SITE_OTHER): Payer: Medicare Other | Admitting: Endocrinology

## 2018-05-28 VITALS — BP 120/68 | HR 98 | Ht 61.0 in | Wt 168.0 lb

## 2018-05-28 DIAGNOSIS — Z794 Long term (current) use of insulin: Secondary | ICD-10-CM

## 2018-05-28 DIAGNOSIS — N183 Chronic kidney disease, stage 3 (moderate): Secondary | ICD-10-CM | POA: Diagnosis not present

## 2018-05-28 DIAGNOSIS — E1122 Type 2 diabetes mellitus with diabetic chronic kidney disease: Secondary | ICD-10-CM | POA: Diagnosis not present

## 2018-05-28 DIAGNOSIS — E119 Type 2 diabetes mellitus without complications: Secondary | ICD-10-CM | POA: Diagnosis not present

## 2018-05-28 LAB — POCT GLYCOSYLATED HEMOGLOBIN (HGB A1C): Hemoglobin A1C: 6.1 % — AB (ref 4.0–5.6)

## 2018-05-28 MED ORDER — METFORMIN HCL 1000 MG PO TABS: 1000.0000 mg | ORAL_TABLET | ORAL | 3 refills | Status: AC

## 2018-05-28 NOTE — Patient Instructions (Addendum)
Please go back to see Dr Ranell Patrick, about your knee pain.  The meloxicam is not good for your kidneys. good diet and exercise significantly improve the control of your diabetes.  please let me know if you wish to be referred to a dietician.  high blood sugar is very risky to your health.  you should see an eye doctor and dentist every year.  It is very important to get all recommended vaccinations.  Controlling your blood pressure and cholesterol drastically reduces the damage diabetes does to your body.  Those who smoke should quit.  Please discuss these with your doctor.   check your blood sugar twice a day.  vary the time of day when you check, between before the 3 meals, and at bedtime.  also check if you have symptoms of your blood sugar being too high or too low.  please keep a record of the readings and bring it to your next appointment here (or you can bring the meter itself).  You can write it on any piece of paper.  please call us sooner if your blood sugar goes below 70, or if you have a lot of readings over 200. Please reduce the metformin to the morning only, and:  Please continue the same insulin.  Please come back for a follow-up appointment in 2 months.

## 2018-05-28 NOTE — Progress Notes (Signed)
Subjective:    Patient ID: Lorraine Alexander, female    DOB: 09/06/1933, 85 y.o.   MRN: [id number]  HPI pt is referred by Marcelline Mates, PA, for diabetes.  Pt states DM was dx'ed in 2010; she has mild if any neuropathy of the lower extremities, but she has associated renal failure; she has been on insulin since 2012; pt says her diet is good, but exercise is not good; she has never had GDM, pancreatitis, pancreatic surgery, severe hypoglycemia or DKA.  He takes lantus, 30/d, and metformin.  She gets steroid injections into the knees (last was 1/20).  She also takes meloxicam for knee pain, also.  She says cbg varies from 95-130.  Past Medical History:  Diagnosis Date  . Arthritis   . Asthma   . Diabetes mellitus without complication (HCC)   . Glaucoma   . Hyperlipidemia   . Hypertension     Past Surgical History:  Procedure Laterality Date  . ABDOMINAL HYSTERECTOMY      Social History   Socioeconomic History  . Marital status: Married    Spouse name: Not on file  . Number of children: Not on file  . Years of education: Not on file  . Highest education level: Not on file  Occupational History  . Not on file  Social Needs  . Financial resource strain: Not on file  . Food insecurity:    Worry: Not on file    Inability: Not on file  . Transportation needs:    Medical: Not on file    Non-medical: Not on file  Tobacco Use  . Smoking status: Never Smoker  . Smokeless tobacco: Never Used  Substance and Sexual Activity  . Alcohol use: Not Currently  . Drug use: Not Currently  . Sexual activity: Not on file  Lifestyle  . Physical activity:    Days per week: Not on file    Minutes per session: Not on file  . Stress: Not on file  Relationships  . Social connections:    Talks on phone: Not on file    Gets together: Not on file    Attends religious service: Not on file    Active member of club or organization: Not on file    Attends meetings of clubs or organizations: Not  on file    Relationship status: Not on file  . Intimate partner violence:    Fear of current or ex partner: Not on file    Emotionally abused: Not on file    Physically abused: Not on file    Forced sexual activity: Not on file  Other Topics Concern  . Not on file  Social History Narrative  . Not on file    Current Outpatient Medications on File Prior to Visit  Medication Sig Dispense Refill  . amLODipine (NORVASC) 5 MG tablet Take 1 tablet (5 mg total) by mouth daily. 90 tablet 1  . aspirin EC 81 MG tablet Take 81 mg by mouth daily.    Marland Kitchen atorvastatin (LIPITOR) 40 MG tablet Take 1 tablet (40 mg total) by mouth daily. 90 tablet 1  . Cholecalciferol (VITAMIN D3) 125 MCG (5000 UT) CAPS Take 1 capsule by mouth daily.    . Fluticasone-Salmeterol (ADVAIR DISKUS) 250-50 MCG/DOSE AEPB Inhale 1 puff into the lungs 2 (two) times daily.    . Insulin Glargine (LANTUS SOLOSTAR) 100 UNIT/ML Solostar Pen Inject 30 Units into the skin daily.    . meloxicam (MOBIC) 15 MG tablet Take  1 tablet (15 mg total) by mouth daily. 30 tablet 1  . valsartan-hydrochlorothiazide (DIOVAN HCT) 160-25 MG tablet Take 1 tablet by mouth daily.     No current facility-administered medications on file prior to visit.     No Known Allergies  Family History  Problem Relation Age of Onset  . Diabetes Mother   . Cancer Father   . Arthritis Sister   . Diabetes Sister   . Heart disease Sister   . Arthritis Brother   . Arthritis Sister   . Arthritis Sister   . Hearing loss Sister   . Arthritis Brother   . Heart disease Brother     BP 120/68 (BP Location: Left Arm, Patient Position: Sitting, Cuff Size: Large)   Pulse 98   Ht 5\' 1"  (1.549 m)   Wt 168 lb (76.2 kg)   SpO2 94%   BMI 31.74 kg/m   Review of Systems denies blurry vision, headache, chest pain, sob, n/v, urinary frequency, muscle cramps, excessive diaphoresis, memory loss, depression, cold intolerance, rhinorrhea, and easy bruising.  She has slight  chronic weight loss.       Objective:   Physical Exam VS: see vs page GEN: no distress HEAD: head: no deformity eyes: no periorbital swelling, no proptosis external nose and ears are normal mouth: no lesion seen NECK: supple, thyroid is not enlarged CHEST WALL: no deformity LUNGS: clear to auscultation CV: reg rate and rhythm, no murmur ABD: abdomen is soft, nontender.  no hepatosplenomegaly.  not distended.  no hernia MUSCULOSKELETAL: muscle bulk and strength are grossly normal.  no obvious joint swelling.  gait is normal and steady.  EXTEMITIES: no deformity.  no ulcer on the feet.  feet are of normal color and temp.  no leg edema.  There is bilateral onychomycosis of the toenails.   PULSES: dorsalis pedis intact bilat.  no carotid bruit NEURO:  cn 2-12 grossly intact.   readily moves all 4's.  sensation is intact to touch on the feet.   SKIN:  Normal texture and temperature.  No rash or suspicious lesion is visible.   NODES:  None palpable at the neck PSYCH: alert, well-oriented.  Does not appear anxious nor depressed.  Lab Results  Component Value Date   CREATININE 1.36 (H) 12/14/2017   BUN 35 (H) 12/14/2017   NA 141 12/14/2017   K 4.5 12/14/2017   CL 105 12/14/2017   CO2 26 12/14/2017   Lab Results  Component Value Date   HGBA1C 6.1 (A) 05/28/2018   I have reviewed outside records, and summarized: Pt was noted to have elevated a1c, and referred here.  She was seen several times for AB, but steroids were not rx'ed, due to DM.       Assessment & Plan:  Insulin-requiring type 2 DM: overcontrolled, given this regimen, which does match insulin to her changing needs throughout the day.   Knee pain: we discussed.  She should avoid NSAID.   Renal failure: she should phase out metformin.   Patient Instructions  Please go back to see Dr Ranell PatrickNorris, about your knee pain.  The meloxicam is not good for your kidneys. good diet and exercise significantly improve the control of your  diabetes.  please let me know if you wish to be referred to a dietician.  high blood sugar is very risky to your health.  you should see an eye doctor and dentist every year.  It is very important to get all recommended vaccinations.  Controlling  your blood pressure and cholesterol drastically reduces the damage diabetes does to your body.  Those who smoke should quit.  Please discuss these with your doctor.   check your blood sugar twice a day.  vary the time of day when you check, between before the 3 meals, and at bedtime.  also check if you have symptoms of your blood sugar being too high or too low.  please keep a record of the readings and bring it to your next appointment here (or you can bring the meter itself).  You can write it on any piece of paper.  please call us sooner if your blood sugar goes below 70, or if you have a lot of readings over 200. Please reduce the metformin to the morning only, and:  Please continue the same insulin.  Please come back for a follow-up appointment in 2 months.

## 2018-05-29 ENCOUNTER — Other Ambulatory Visit: Payer: Self-pay | Admitting: Physician Assistant

## 2018-05-29 ENCOUNTER — Ambulatory Visit (INDEPENDENT_AMBULATORY_CARE_PROVIDER_SITE_OTHER): Payer: Self-pay | Admitting: Orthopaedic Surgery

## 2018-05-29 DIAGNOSIS — G8929 Other chronic pain: Secondary | ICD-10-CM

## 2018-05-29 NOTE — Telephone Encounter (Signed)
Copied from CRM [id number]. Topic: Quick Communication - Rx Refill/Question >> May 29, 2018  2:29 PM Lorraine Alexander E wrote: Medication: methocarbamol (ROBAXIN) 750 MG tablet - 90 day supply   Has the patient contacted their pharmacy? Yes   Preferred Pharmacy (with phone number or street name): [facility] [place] [facility] [place], [place] - [address] AT Portal to Registered [employer] Sites [phone] (Phone) [phone] (Fax)    Agent: Please be advised that RX refills may take up to 3 business days. We ask that you follow-up with your pharmacy.

## 2018-05-29 NOTE — Telephone Encounter (Signed)
Last rx for Robaxin 04/25/18 # 60 Patient requesting [facility]

## 2018-05-30 MED ORDER — METHOCARBAMOL 750 MG PO TABS: 750.0000 mg | ORAL_TABLET | Freq: Two times a day (BID) | ORAL | 0 refills | Status: DC

## 2018-05-30 NOTE — Telephone Encounter (Signed)
Rx sent 

## 2018-06-05 ENCOUNTER — Telehealth: Payer: Self-pay | Admitting: Physician Assistant

## 2018-06-05 NOTE — Telephone Encounter (Signed)
I have placed a disability parking placard in the bin upfront with a charge sheet.

## 2018-06-05 NOTE — Telephone Encounter (Signed)
Handicap placard in your bin for completion.

## 2018-06-06 NOTE — Telephone Encounter (Signed)
Requested forms have been mailed.

## 2018-06-06 NOTE — Telephone Encounter (Signed)
Pt called to request forms be mailed to her home as she does not have a way to pick them up. Please advise.  [address]  [place] [address]

## 2018-06-06 NOTE — Telephone Encounter (Signed)
LMOVM making pt aware that forms are ready for p/u.

## 2018-06-06 NOTE — Telephone Encounter (Signed)
Form completed, signed and placed up front in file cabinet for pick up.

## 2018-06-17 ENCOUNTER — Telehealth: Payer: Self-pay | Admitting: Physician Assistant

## 2018-06-17 DIAGNOSIS — I10 Essential (primary) hypertension: Secondary | ICD-10-CM

## 2018-06-17 MED ORDER — VALSARTAN-HYDROCHLOROTHIAZIDE 160-25 MG PO TABS: 1.0000 | ORAL_TABLET | Freq: Every day | ORAL | 1 refills | Status: DC

## 2018-06-17 NOTE — Telephone Encounter (Signed)
Diovan-HCT [facility]

## 2018-06-17 NOTE — Addendum Note (Signed)
Addended by: Leonidas Romberg on: 06/17/2018 03:19 PM   Modules accepted: Orders

## 2018-06-17 NOTE — Telephone Encounter (Signed)
Pt asking for valsartan-hydrochlorothiazide (DIOVAN HCT) 160-25 MG tablet [facility]

## 2018-06-19 ENCOUNTER — Telehealth: Payer: Self-pay | Admitting: Physician Assistant

## 2018-06-19 DIAGNOSIS — I10 Essential (primary) hypertension: Secondary | ICD-10-CM

## 2018-06-19 NOTE — Telephone Encounter (Signed)
Copied from [place] [id number]. Topic: Quick Communication - Rx Refill/Question >> Jun 19, 2018  2:51 PM Celene Kras A wrote: Medication: valsartan-hydrochlorothiazide (DIOVAN HCT) 160-25 MG tablet  Has the patient contacted their pharmacy? Yes.  Pt  called stating she needs this prescription sent in again. Pt states she needs it sent in as "brand only". Please advise.  (Agent: If no, request that the patient contact the pharmacy for the refill.) (Agent: If yes, when and what did the pharmacy advise?)  Preferred Pharmacy (with phone number or street name): [facility] [place], [place] to [address] [place] [address] Phone: [phone] Fax: [phone] Not a 24 hour pharmacy; exact hours not known.    Agent: Please be advised that RX refills may take up to 3 business days. We ask that you follow-up with your pharmacy.

## 2018-06-20 ENCOUNTER — Encounter: Payer: Self-pay | Admitting: Cardiology

## 2018-06-20 MED ORDER — DIOVAN HCT 160-25 MG PO TABS: 1.0000 | ORAL_TABLET | Freq: Every day | ORAL | 1 refills | Status: AC

## 2018-06-20 NOTE — Telephone Encounter (Signed)
Resent the rx for Brand name only Diovan-HCT

## 2018-07-02 ENCOUNTER — Ambulatory Visit: Payer: Medicare Other

## 2018-07-03 ENCOUNTER — Other Ambulatory Visit: Payer: Self-pay | Admitting: *Deleted

## 2018-07-03 MED ORDER — MELOXICAM 15 MG PO TABS: 15.0000 mg | ORAL_TABLET | Freq: Every day | ORAL | 0 refills | Status: DC

## 2018-07-10 ENCOUNTER — Telehealth: Payer: Self-pay | Admitting: Physician Assistant

## 2018-07-10 DIAGNOSIS — I10 Essential (primary) hypertension: Secondary | ICD-10-CM

## 2018-07-10 NOTE — Telephone Encounter (Signed)
Medication Refill - Medication: Fluticasone-Salmeterol (ADVAIR DISKUS) 250-50 MCG/DOSE AEPB , amLODipine (NORVASC) 5 MG tablet    Has the patient contacted their pharmacy? Yes.   (Agent: If no, request that the patient contact the pharmacy for the refill.) (Agent: If yes, when and what did the pharmacy advise?)  Preferred Pharmacy (with phone number or street name):  [place] [phone] (Phone) [phone] (Fax)     Agent: Please be advised that RX refills may take up to 3 business days. We ask that you follow-up with your pharmacy.

## 2018-07-11 MED ORDER — FLUTICASONE-SALMETEROL 250-50 MCG/DOSE IN AEPB: 1.0000 | INHALATION_SPRAY | Freq: Two times a day (BID) | RESPIRATORY_TRACT | 3 refills | Status: AC

## 2018-07-11 MED ORDER — AMLODIPINE BESYLATE 5 MG PO TABS: 5.0000 mg | ORAL_TABLET | Freq: Every day | ORAL | 1 refills | Status: AC

## 2018-07-11 NOTE — Telephone Encounter (Signed)
Medications was refilled.  

## 2018-07-25 ENCOUNTER — Encounter: Payer: Medicare Other | Admitting: Physician Assistant

## 2018-07-31 ENCOUNTER — Other Ambulatory Visit: Payer: Self-pay

## 2018-07-31 ENCOUNTER — Other Ambulatory Visit: Payer: Self-pay | Admitting: Emergency Medicine

## 2018-07-31 ENCOUNTER — Telehealth: Payer: Self-pay | Admitting: Physician Assistant

## 2018-07-31 DIAGNOSIS — E1169 Type 2 diabetes mellitus with other specified complication: Secondary | ICD-10-CM

## 2018-07-31 DIAGNOSIS — E785 Hyperlipidemia, unspecified: Secondary | ICD-10-CM

## 2018-07-31 MED ORDER — ATORVASTATIN CALCIUM 40 MG PO TABS: 40.0000 mg | ORAL_TABLET | Freq: Every day | ORAL | 1 refills | Status: AC

## 2018-07-31 NOTE — Telephone Encounter (Signed)
Copied from [place] [id number]. Topic: Quick Communication - Rx Refill/Question >> Jul 31, 2018  2:54 PM Erick Blinks wrote: Medication: atorvastatin (LIPITOR) 40 MG tablet  Has the patient contacted their pharmacy? Yes.   (Agent: If no, request that the patient contact the pharmacy for the refill.) (Agent: If yes, when and what did the pharmacy advise?)  Preferred Pharmacy (with phone number or street name):  [place] [address] Phone: [phone] Fax: [phone]   Agent: Please be advised that RX refills may take up to 3 business days. We ask that you follow-up with your pharmacy.

## 2018-08-02 ENCOUNTER — Encounter: Payer: Self-pay | Admitting: Physician Assistant

## 2018-08-02 ENCOUNTER — Ambulatory Visit (INDEPENDENT_AMBULATORY_CARE_PROVIDER_SITE_OTHER): Payer: Medicare Other | Admitting: Physician Assistant

## 2018-08-02 ENCOUNTER — Other Ambulatory Visit: Payer: Self-pay

## 2018-08-02 VITALS — BP 112/78 | HR 86 | Temp 97.6°F | Resp 14 | Ht 60.5 in | Wt 173.0 lb

## 2018-08-02 DIAGNOSIS — E1169 Type 2 diabetes mellitus with other specified complication: Secondary | ICD-10-CM

## 2018-08-02 DIAGNOSIS — E1122 Type 2 diabetes mellitus with diabetic chronic kidney disease: Secondary | ICD-10-CM | POA: Diagnosis not present

## 2018-08-02 DIAGNOSIS — Z794 Long term (current) use of insulin: Secondary | ICD-10-CM

## 2018-08-02 DIAGNOSIS — I10 Essential (primary) hypertension: Secondary | ICD-10-CM | POA: Diagnosis not present

## 2018-08-02 DIAGNOSIS — E785 Hyperlipidemia, unspecified: Secondary | ICD-10-CM

## 2018-08-02 DIAGNOSIS — Z780 Asymptomatic menopausal state: Secondary | ICD-10-CM | POA: Diagnosis not present

## 2018-08-02 DIAGNOSIS — N183 Chronic kidney disease, stage 3 (moderate): Secondary | ICD-10-CM | POA: Diagnosis not present

## 2018-08-02 DIAGNOSIS — Z0000 Encounter for general adult medical examination without abnormal findings: Secondary | ICD-10-CM | POA: Diagnosis not present

## 2018-08-02 NOTE — Progress Notes (Signed)
Subjective:   Lorraine Alexander is a 85 y.o. female who presents for Medicare Annual (Subsequent) preventive examination.  Review of Systems:  Review of Systems  Constitutional: Negative for fever and weight loss.  HENT: Negative for ear discharge, ear pain, hearing loss and tinnitus.   Eyes: Negative for blurred vision, double vision, photophobia and pain.  Respiratory: Negative for cough and shortness of breath.   Cardiovascular: Negative for chest pain and palpitations.  Gastrointestinal: Negative for abdominal pain, blood in stool, constipation, diarrhea, heartburn, melena, nausea and vomiting.  Genitourinary: Negative for dysuria, flank pain, frequency, hematuria and urgency.  Musculoskeletal: Negative for falls.  Neurological: Negative for dizziness, loss of consciousness and headaches.  Endo/Heme/Allergies: Negative for environmental allergies.  Psychiatric/Behavioral: Negative for depression, hallucinations, substance abuse and suicidal ideas. The patient is not nervous/anxious and does not have insomnia.    Objective:    Vitals: BP 112/78   Pulse 86   Temp 97.6 F (36.4 C) (Skin)   Resp 14   Ht 5' 0.5" (1.537 m)   Wt 173 lb (78.5 kg)   SpO2 98%   BMI 33.23 kg/m   Body mass index is 33.23 kg/m.  No flowsheet data found.  Tobacco Social History   Tobacco Use  Smoking Status Never Smoker  Smokeless Tobacco Never Used     Counseling given: Yes   Clinical Intake:  Pre-visit preparation completed: No  Pain : No/denies pain     Nutritional Status: BMI > 30  Obese Diabetes: No  How often do you need to have someone help you when you read instructions, pamphlets, or other written materials from your doctor or pharmacy?: 1 - Never  Interpreter Needed?: No     Past Medical History:  Diagnosis Date  . Arthritis   . Asthma   . Diabetes mellitus without complication (HCC)   . Glaucoma   . Hyperlipidemia   . Hypertension    Past Surgical History:   Procedure Laterality Date  . ABDOMINAL HYSTERECTOMY     Family History  Problem Relation Age of Onset  . Diabetes Mother   . Cancer Father   . Arthritis Sister   . Diabetes Sister   . Heart disease Sister   . Arthritis Brother   . Arthritis Sister   . Arthritis Sister   . Hearing loss Sister   . Arthritis Brother   . Heart disease Brother    Social History   Socioeconomic History  . Marital status: Married    Spouse name: Not on file  . Number of children: Not on file  . Years of education: Not on file  . Highest education level: Not on file  Occupational History  . Not on file  Social Needs  . Financial resource strain: Not on file  . Food insecurity    Worry: Not on file    Inability: Not on file  . Transportation needs    Medical: Not on file    Non-medical: Not on file  Tobacco Use  . Smoking status: Never Smoker  . Smokeless tobacco: Never Used  Substance and Sexual Activity  . Alcohol use: Not Currently  . Drug use: Not Currently  . Sexual activity: Not on file  Lifestyle  . Physical activity    Days per week: Not on file    Minutes per session: Not on file  . Stress: Not on file  Relationships  . Social connections    Talks on phone: Not on file  Gets together: Not on file    Attends religious service: Not on file    Active member of club or organization: Not on file    Attends meetings of clubs or organizations: Not on file    Relationship status: Not on file  Other Topics Concern  . Not on file  Social History Narrative  . Not on file    Outpatient Encounter Medications as of 08/02/2018  Medication Sig  . amLODipine (NORVASC) 5 MG tablet Take 1 tablet (5 mg total) by mouth daily.  Marland Kitchen aspirin EC 81 MG tablet Take 81 mg by mouth daily.  Marland Kitchen atorvastatin (LIPITOR) 40 MG tablet Take 1 tablet (40 mg total) by mouth daily.  . Cholecalciferol (VITAMIN D3) 125 MCG (5000 UT) CAPS Take 1 capsule by mouth daily.  Marland Kitchen DIOVAN HCT 160-25 MG tablet Take 1  tablet by mouth daily.  . Fluticasone-Salmeterol (ADVAIR DISKUS) 250-50 MCG/DOSE AEPB Inhale 1 puff into the lungs 2 (two) times daily.  . Insulin Glargine (LANTUS SOLOSTAR) 100 UNIT/ML Solostar Pen Inject 30 Units into the skin daily.  . meloxicam (MOBIC) 15 MG tablet Take 1 tablet (15 mg total) by mouth daily. Patient needs appointment for any future refills  . metFORMIN (GLUCOPHAGE) 1000 MG tablet Take 1 tablet (1,000 mg total) by mouth every morning.  . methocarbamol (ROBAXIN) 750 MG tablet Take 1 tablet (750 mg total) by mouth 2 (two) times daily.   No facility-administered encounter medications on file as of 08/02/2018.     Activities of Daily Living In your present state of health, do you have any difficulty performing the following activities: 08/02/2018 12/14/2017  Hearing? N N  Vision? N N  Difficulty concentrating or making decisions? N N  Walking or climbing stairs? N N  Dressing or bathing? N N  Doing errands, shopping? N N  Some recent data might be hidden    Patient Care Team: Delorse Limber as PCP - General (Family Medicine) Netta Cedars, MD as Consulting Physician (Orthopedic Surgery) Ashok Pall, MD as Consulting Physician (Neurosurgery) Renato Shin, MD as Consulting Physician (Endocrinology)    Assessment:   This is a routine wellness examination for Lorraine Alexander.  Exercise Activities and Dietary recommendations Current Exercise Habits: The patient does not participate in regular exercise at present  Goals   None     Fall Risk Fall Risk  08/02/2018  Falls in the past year? 0  Number falls in past yr: 0  Injury with Fall? 0  Follow up Falls evaluation completed   Is the patient's home free of loose throw rugs in walkways, pet beds, electrical cords, etc?   yes      Grab bars in the bathroom? no      Handrails on the stairs?   yes      Adequate lighting?   yes  Timed Get Up and Go performed: Within normal limits.  Depression Screen PHQ 2/9  Scores 08/02/2018 01/25/2018  PHQ - 2 Score 0 0  PHQ- 9 Score 0 -     Cognitive Function MMSE - Mini Mental State Exam 08/03/2018  Orientation to time 5  Orientation to Place 5  Registration 3  Attention/ Calculation 5  Recall 2  Language- name 2 objects 2  Language- repeat 1  Language- follow 3 step command 3  Language- read & follow direction 1  Write a sentence 1  Copy design 1  Total score 29        Immunization History  Administered Date(s) Administered  . Influenza, High Dose Seasonal PF 12/14/2017    Screening Tests Health Maintenance  Topic Date Due  . FOOT EXAM  02/19/1943  . DEXA SCAN  02/20/2000  . PNA vac Low Risk Adult (1 of 2 - PCV13) 02/20/2000  . DTaP/Tdap/Td (1 - Tdap) 08/02/2019 (Originally 02/19/1952)  . TETANUS/TDAP  08/02/2019 (Originally 02/19/1952)  . INFLUENZA VACCINE  08/10/2018  . OPHTHALMOLOGY EXAM  10/18/2018  . HEMOGLOBIN A1C  11/28/2018    Cancer Screenings: Lung: Low Dose CT Chest recommended if Age 30-80 years, 30 pack-year currently smoking OR have quit w/in 15years. Patient does not qualify. Breast:  Up to date on Mammogram? Yes   Up to date of Bone Density/Dexa? No - Order placed.     Plan:  1. Encounter for Medicare annual wellness exam During the course of the visit the patient was educated and counseled about appropriate screening and preventive services including: Fall prevention, Bone densitometry screening, Diabetes screening, Nutrition counseling.  Patient UTD on required immunizations. Will obtain fasting labs at today's visit..   Patient Instructions (the written plan) was given to the patient.    2. Postmenopausal estrogen deficiency - DG Bone Density; Future  3. Type 2 diabetes mellitus with stage 3 chronic kidney disease, with long-term current use of insulin (HCC) Followed by Endocrinology. Continue management per specialist.  - CBC with Differential/Platelet - Comprehensive metabolic panel - Lipid panel  4.  Essential hypertension BP stable. Asymptomatic. Continue current regimen. Labs today. - CBC with Differential/Platelet - Comprehensive metabolic panel - Lipid panel  5. Hyperlipidemia associated with type 2 diabetes mellitus (HCC) Taking medications as directed. Body mass index is 33.23 kg/m. Dietary and exercise recommendations reviewed with patient. Repeat fasting labs today. - CBC with Differential/Platelet - Comprehensive metabolic panel - Lipid panel   I have personally reviewed and noted the following in the patient's chart:   . Medical and social history . Use of alcohol, tobacco or illicit drugs  . Current medications and supplements . Functional ability and status . Nutritional status . Physical activity . Advanced directives . List of other physicians . Hospitalizations, surgeries, and ER visits in previous 12 months . Vitals . Screenings to include cognitive, depression, and falls . Referrals and appointments  In addition, I have reviewed and discussed with patient certain preventive protocols, quality metrics, and best practice recommendations. A written personalized care plan for preventive services as well as general preventive health recommendations were provided to patient.     Piedad ClimesWilliam Cody Joshual Terrio, PA-C  08/03/2018

## 2018-08-02 NOTE — Patient Instructions (Addendum)
Please go to the lab today for blood work.  I will call you with your results. We will alter treatment regimen(s) if indicated by your results.   I am working on getting you set up for your bone density screening.   Please follow-up with specialists as scheduled.  Limit salt intake but keep hydrated. If labs normal and swelling not improving with this, we may need to attempt trial off of the amlodipine.    Preventive Care 23 Years and Older, Female Preventive care refers to lifestyle choices and visits with your health care provider that can promote health and wellness. This includes:  A yearly physical exam. This is also called an annual well check.  Regular dental and eye exams.  Immunizations.  Screening for certain conditions.  Healthy lifestyle choices, such as diet and exercise. What can I expect for my preventive care visit? Physical exam Your health care provider will check:  Height and weight. These may be used to calculate body mass index (BMI), which is a measurement that tells if you are at a healthy weight.  Heart rate and blood pressure.  Your skin for abnormal spots. Counseling Your health care provider may ask you questions about:  Alcohol, tobacco, and drug use.  Emotional well-being.  Home and relationship well-being.  Sexual activity.  Eating habits.  History of falls.  Memory and ability to understand (cognition).  Work and work Statistician.  Pregnancy and menstrual history. What immunizations do I need?  Influenza (flu) vaccine  This is recommended every year. Tetanus, diphtheria, and pertussis (Tdap) vaccine  You may need a Td booster every 10 years. Varicella (chickenpox) vaccine  You may need this vaccine if you have not already been vaccinated. Zoster (shingles) vaccine  You may need this after age 17. Pneumococcal conjugate (PCV13) vaccine  One dose is recommended after age 15. Pneumococcal polysaccharide (PPSV23)  vaccine  One dose is recommended after age 45. Measles, mumps, and rubella (MMR) vaccine  You may need at least one dose of MMR if you were born in 1957 or later. You may also need a second dose. Meningococcal conjugate (MenACWY) vaccine  You may need this if you have certain conditions. Hepatitis A vaccine  You may need this if you have certain conditions or if you travel or work in places where you may be exposed to hepatitis A. Hepatitis B vaccine  You may need this if you have certain conditions or if you travel or work in places where you may be exposed to hepatitis B. Haemophilus influenzae type b (Hib) vaccine  You may need this if you have certain conditions. You may receive vaccines as individual doses or as more than one vaccine together in one shot (combination vaccines). Talk with your health care provider about the risks and benefits of combination vaccines. What tests do I need? Blood tests  Lipid and cholesterol levels. These may be checked every 5 years, or more frequently depending on your overall health.  Hepatitis C test.  Hepatitis B test. Screening  Lung cancer screening. You may have this screening every year starting at age 77 if you have a 30-pack-year history of smoking and currently smoke or have quit within the past 15 years.  Colorectal cancer screening. All adults should have this screening starting at age 38 and continuing until age 65. Your health care provider may recommend screening at age 66 if you are at increased risk. You will have tests every 1-10 years, depending on your results  and the type of screening test.  Diabetes screening. This is done by checking your blood sugar (glucose) after you have not eaten for a while (fasting). You may have this done every 1-3 years.  Mammogram. This may be done every 1-2 years. Talk with your health care provider about how often you should have regular mammograms.  BRCA-related cancer screening. This may  be done if you have a family history of breast, ovarian, tubal, or peritoneal cancers. Other tests  Sexually transmitted disease (STD) testing.  Bone density scan. This is done to screen for osteoporosis. You may have this done starting at age 85. Follow these instructions at home: Eating and drinking  Eat a diet that includes fresh fruits and vegetables, whole grains, lean protein, and low-fat dairy products. Limit your intake of foods with high amounts of sugar, saturated fats, and salt.  Take vitamin and mineral supplements as recommended by your health care provider.  Do not drink alcohol if your health care provider tells you not to drink.  If you drink alcohol: ? Limit how much you have to 0-1 drink a day. ? Be aware of how much alcohol is in your drink. In the [place] one drink equals one 12 oz bottle of beer (355 mL), one 5 oz glass of wine (148 mL), or one 1 oz glass of hard liquor (44 mL). Lifestyle  Take daily care of your teeth and gums.  Stay active. Exercise for at least 30 minutes on 5 or more days each week.  Do not use any products that contain nicotine or tobacco, such as cigarettes, e-cigarettes, and chewing tobacco. If you need help quitting, ask your health care provider.  If you are sexually active, practice safe sex. Use a condom or other form of protection in order to prevent STIs (sexually transmitted infections).  Talk with your health care provider about taking a low-dose aspirin or statin. What's next?  Go to your health care provider once a year for a well check visit.  Ask your health care provider how often you should have your eyes and teeth checked.  Stay up to date on all vaccines. This information is not intended to replace advice given to you by your health care provider. Make sure you discuss any questions you have with your health care provider. Document Released: 01/22/2015 Document Revised: 12/20/2017 Document Reviewed: 12/20/2017 Elsevier  Patient Education  2020 [employer].

## 2018-08-03 LAB — CBC WITH DIFFERENTIAL/PLATELET
Absolute Monocytes: 487 cells/uL (ref 200–950)
Basophils Absolute: 34 cells/uL (ref 0–200)
Basophils Relative: 0.4 %
Eosinophils Absolute: 92 cells/uL (ref 15–500)
Eosinophils Relative: 1.1 %
HCT: 32.4 % — ABNORMAL LOW (ref 35.0–45.0)
Hemoglobin: 10.7 g/dL — ABNORMAL LOW (ref 11.7–15.5)
Lymphs Abs: 2570 cells/uL (ref 850–3900)
MCH: 29.2 pg (ref 27.0–33.0)
MCHC: 33 g/dL (ref 32.0–36.0)
MCV: 88.5 fL (ref 80.0–100.0)
MPV: 10.3 fL (ref 7.5–12.5)
Monocytes Relative: 5.8 %
Neutro Abs: 5216 cells/uL (ref 1500–7800)
Neutrophils Relative %: 62.1 %
Platelets: 275 10*3/uL (ref 140–400)
RBC: 3.66 10*6/uL — ABNORMAL LOW (ref 3.80–5.10)
RDW: 12.6 % (ref 11.0–15.0)
Total Lymphocyte: 30.6 %
WBC: 8.4 10*3/uL (ref 3.8–10.8)

## 2018-08-03 LAB — COMPREHENSIVE METABOLIC PANEL
AG Ratio: 1.4 (calc) (ref 1.0–2.5)
ALT: 10 U/L (ref 6–29)
AST: 13 U/L (ref 10–35)
Albumin: 3.8 g/dL (ref 3.6–5.1)
Alkaline phosphatase (APISO): 96 U/L (ref 37–153)
BUN/Creatinine Ratio: 23 (calc) — ABNORMAL HIGH (ref 6–22)
BUN: 33 mg/dL — ABNORMAL HIGH (ref 7–25)
CO2: 25 mmol/L (ref 20–32)
Calcium: 9.4 mg/dL (ref 8.6–10.4)
Chloride: 103 mmol/L (ref 98–110)
Creat: 1.42 mg/dL — ABNORMAL HIGH (ref 0.60–0.88)
Globulin: 2.7 g/dL (calc) (ref 1.9–3.7)
Glucose, Bld: 106 mg/dL — ABNORMAL HIGH (ref 65–99)
Potassium: 4.5 mmol/L (ref 3.5–5.3)
Sodium: 138 mmol/L (ref 135–146)
Total Bilirubin: 0.3 mg/dL (ref 0.2–1.2)
Total Protein: 6.5 g/dL (ref 6.1–8.1)

## 2018-08-03 LAB — LIPID PANEL
Cholesterol: 185 mg/dL (ref <200)
HDL: 70 mg/dL (ref >=50)
LDL Cholesterol (Calc): 99 mg/dL (calc)
Non-HDL Cholesterol (Calc): 115 mg/dL (calc) (ref <130)
Total CHOL/HDL Ratio: 2.6 (calc) (ref <5.0)
Triglycerides: 70 mg/dL (ref <150)

## 2018-08-06 ENCOUNTER — Other Ambulatory Visit: Payer: Self-pay | Admitting: *Deleted

## 2018-08-06 MED ORDER — MELOXICAM 15 MG PO TABS: 15.0000 mg | ORAL_TABLET | Freq: Every day | ORAL | 0 refills | Status: DC

## 2018-08-12 ENCOUNTER — Telehealth: Payer: Self-pay

## 2018-08-12 ENCOUNTER — Other Ambulatory Visit: Payer: Self-pay

## 2018-08-12 ENCOUNTER — Other Ambulatory Visit: Payer: Self-pay | Admitting: Emergency Medicine

## 2018-08-12 DIAGNOSIS — E119 Type 2 diabetes mellitus without complications: Secondary | ICD-10-CM

## 2018-08-12 DIAGNOSIS — Z794 Long term (current) use of insulin: Secondary | ICD-10-CM

## 2018-08-12 MED ORDER — ONETOUCH ULTRA VI STRP: ORAL_STRIP | 12 refills | Status: AC

## 2018-08-12 MED ORDER — ONETOUCH ULTRASOFT LANCETS MISC: 12 refills | Status: AC

## 2018-08-12 NOTE — Telephone Encounter (Signed)
Can contact pharmacy to see what meter or strips were last filled to see if this helps. Otherwise she will need to find out the name of her meter so strips can be filled or we will need to send in new meter and supplies

## 2018-08-12 NOTE — Telephone Encounter (Signed)
Patient called in requesting a refill of her "needles and strips." Was unable to tell me the brand that she is using. I was not able to see any of this on her current medication list, just a script for LANTUS. Verified that LANTUS pen is not what she needed refilled. Please advise.

## 2018-08-12 NOTE — Telephone Encounter (Signed)
DM supplies have been sent to pharmacy

## 2018-08-15 ENCOUNTER — Other Ambulatory Visit: Payer: Self-pay

## 2018-08-15 ENCOUNTER — Ambulatory Visit
Admission: RE | Admit: 2018-08-15 | Discharge: 2018-08-15 | Disposition: A | Discharge status: Home / Self Care | Payer: Medicare Other | Source: Ambulatory Visit | Attending: Physician Assistant | Admitting: Physician Assistant

## 2018-08-15 DIAGNOSIS — Z1231 Encounter for screening mammogram for malignant neoplasm of breast: Secondary | ICD-10-CM

## 2018-08-16 ENCOUNTER — Ambulatory Visit (INDEPENDENT_AMBULATORY_CARE_PROVIDER_SITE_OTHER): Payer: Medicare Other | Admitting: *Deleted

## 2018-08-16 DIAGNOSIS — N289 Disorder of kidney and ureter, unspecified: Secondary | ICD-10-CM

## 2018-08-16 LAB — BASIC METABOLIC PANEL
BUN: 35 mg/dL — ABNORMAL HIGH (ref 6–23)
CO2: 27 mEq/L (ref 19–32)
Calcium: 9.6 mg/dL (ref 8.4–10.5)
Chloride: 105 mEq/L (ref 96–112)
Creatinine, Ser: 1.41 mg/dL — ABNORMAL HIGH (ref 0.40–1.20)
GFR: 43.05 mL/min — ABNORMAL LOW (ref >60.00)
Glucose, Bld: 200 mg/dL — ABNORMAL HIGH (ref 70–99)
Potassium: 4.5 mEq/L (ref 3.5–5.1)
Sodium: 140 mEq/L (ref 135–145)

## 2018-08-16 MED ORDER — INSULIN PEN NEEDLE 31G X 6 MM MISC: 1.0000 | Freq: Every day | 1 refills | Status: DC

## 2018-08-16 NOTE — Addendum Note (Signed)
Addended by: Katina Dung on: 08/16/2018 02:05 PM   Modules accepted: Orders

## 2018-08-19 ENCOUNTER — Other Ambulatory Visit: Payer: Self-pay | Admitting: Emergency Medicine

## 2018-08-19 DIAGNOSIS — R7989 Other specified abnormal findings of blood chemistry: Secondary | ICD-10-CM

## 2018-08-19 DIAGNOSIS — N183 Chronic kidney disease, stage 3 unspecified: Secondary | ICD-10-CM

## 2018-08-19 DIAGNOSIS — E1122 Type 2 diabetes mellitus with diabetic chronic kidney disease: Secondary | ICD-10-CM

## 2018-08-22 ENCOUNTER — Telehealth: Payer: Self-pay | Admitting: Physician Assistant

## 2018-08-22 DIAGNOSIS — E1122 Type 2 diabetes mellitus with diabetic chronic kidney disease: Secondary | ICD-10-CM

## 2018-08-22 NOTE — Telephone Encounter (Signed)
Medication Refill - Medication: Insulin Pen Needle 31G X 6 MM MISC  Has the patient contacted their pharmacy? yes (Agent: If no, request that the patient contact the pharmacy for the refill.) (Agent: If yes, when and what did the pharmacy advise?)  Preferred Pharmacy (with phone number or street name):  [place] [phone] (Phone) [phone] (Fax)     Agent: Please be advised that RX refills may take up to 3 business days. We ask that you follow-up with your pharmacy.

## 2018-08-23 MED ORDER — INSULIN PEN NEEDLE 31G X 6 MM MISC: 1.0000 | Freq: Every day | 11 refills | Status: AC

## 2018-09-03 ENCOUNTER — Other Ambulatory Visit: Payer: Self-pay | Admitting: Physician Assistant

## 2018-09-03 DIAGNOSIS — M545 Low back pain, unspecified: Secondary | ICD-10-CM

## 2018-09-03 DIAGNOSIS — G8929 Other chronic pain: Secondary | ICD-10-CM

## 2018-09-03 MED ORDER — METHOCARBAMOL 750 MG PO TABS: 750.0000 mg | ORAL_TABLET | Freq: Two times a day (BID) | ORAL | 0 refills | Status: AC

## 2018-09-03 NOTE — Telephone Encounter (Signed)
Pt called in asking for a 90 day supply of the Methocarbamol to be sent in to the [facility], pt can be reached at the home #

## 2018-09-09 ENCOUNTER — Other Ambulatory Visit: Payer: Self-pay | Admitting: Physician Assistant

## 2018-09-09 ENCOUNTER — Telehealth: Payer: Self-pay | Admitting: *Deleted

## 2018-09-09 ENCOUNTER — Encounter: Payer: Self-pay | Admitting: Physician Assistant

## 2018-09-09 NOTE — Telephone Encounter (Signed)
No we will be stopping medication until assessment with her Nephrologist.

## 2018-09-09 NOTE — Telephone Encounter (Signed)
Patient called in asking about the refill on her meloxicam.  She said the pharmacy told her she needs an appointment, but she was just seen on 08/02/2018.  She is also curious if Einar Pheasant wants to keep her on that medication due to her kidney function.  She is calling the nephrologist today to see about scheduling an appointment (she couldn't schedule previously).  If Einar Pheasant wants to keep her on it for now she does need a refill as she took her last one this morning.

## 2018-09-10 NOTE — Telephone Encounter (Signed)
Called and advised pt of PCP recommendations. She would like to know if you have any recommendations for pain since she is stopping the meloxicam.   Ok to leave a message if she does nto answer phone.

## 2018-09-10 NOTE — Telephone Encounter (Signed)
ES Tylenol or something topical like OTC Voltaren cream for now.

## 2018-09-10 NOTE — Telephone Encounter (Signed)
Patient notified of PCP recommendations and is agreement and expresses an understanding.   Ok for PEC to Discuss results / PCP recommendations / Schedule patient.   

## 2018-09-27 ENCOUNTER — Telehealth: Payer: Self-pay | Admitting: Physician Assistant

## 2018-09-27 DIAGNOSIS — N289 Disorder of kidney and ureter, unspecified: Secondary | ICD-10-CM

## 2018-09-27 DIAGNOSIS — R7989 Other specified abnormal findings of blood chemistry: Secondary | ICD-10-CM

## 2018-09-27 NOTE — Telephone Encounter (Signed)
Please advise 

## 2018-09-27 NOTE — Telephone Encounter (Signed)
Ok to wait for [facility] for now. Since she is off of the Mobic and appointment with Nephrology is a little ways off, recommend we recheck renal function. If not improved some or deteriorated further we will see about getting her in sooner with [facility] or sending referral elsewhere. Ok to place order for BMP and schedule patient for a lab-only appointment.

## 2018-09-27 NOTE — Telephone Encounter (Signed)
Pt called in stating she hasn't heard anything from [facility], I made pt aware that I spoke with a Kendrick Fries on 09/18/2018 and she stated that pt was a level 4 and there was awaiting list.   Pt wanted to know if you would like her to wait or should she be sent elsewhere.

## 2018-10-02 NOTE — Telephone Encounter (Signed)
Spoke with patient, advising since she is off/stopped the Mobic and the Nephrologist has not scheduled her yet since it is not urgent. Per PCP can recheck her bmp to make sure levels are improving while off the Mobic.  Patient will have to get her daughter schedule to schedule an lab appointment.  BMP order placed.

## 2018-10-02 NOTE — Addendum Note (Signed)
Addended by: Leonidas Romberg on: 10/02/2018 01:54 PM   Modules accepted: Orders

## 2018-10-11 ENCOUNTER — Ambulatory Visit (INDEPENDENT_AMBULATORY_CARE_PROVIDER_SITE_OTHER): Payer: Medicare Other | Admitting: Physician Assistant

## 2018-10-11 ENCOUNTER — Other Ambulatory Visit: Payer: Self-pay

## 2018-10-11 ENCOUNTER — Encounter: Payer: Self-pay | Admitting: Physician Assistant

## 2018-10-11 VITALS — BP 100/72 | HR 123 | Temp 101.4°F | Resp 24

## 2018-10-11 DIAGNOSIS — R0603 Acute respiratory distress: Secondary | ICD-10-CM | POA: Diagnosis not present

## 2018-10-11 DIAGNOSIS — Z20828 Contact with and (suspected) exposure to other viral communicable diseases: Secondary | ICD-10-CM

## 2018-10-11 DIAGNOSIS — Z20822 Contact with and (suspected) exposure to covid-19: Secondary | ICD-10-CM

## 2018-10-11 NOTE — Progress Notes (Signed)
   Virtual Visit via Video   I connected with patient on 10/11/18 at  3:30 PM EDT by a video enabled telemedicine application and verified that I am speaking with the correct person using two identifiers.  Location patient: Home Location provider: [facility], Office Persons participating in the virtual visit: Patient, Provider, [place] (Patina Moore)  I discussed the limitations of evaluation and management by telemedicine and the availability of in person appointments. The patient expressed understanding and agreed to proceed.  Subjective:   HPI:   Patient presents via D[facility]Me today with daughter c/o 1.5 weeks of cough and fatigue now with shortness of breath, fever with Tmax 101.7 and diarrhea.   ROS:   See pertinent positives and negatives per HPI.  Patient Active Problem List   Diagnosis Date Noted  . Diabetes ([place]) 05/28/2018  . Encounter for immunization 12/16/2017  . Essential hypertension 12/16/2017  . Hyperlipidemia associated with type 2 diabetes mellitus ([place]) 12/16/2017  . Osteoarthritis of left knee 07/19/2017  . Osteoarthritis of right knee 07/19/2017    Social History   Tobacco Use  . Smoking status: Never Smoker  . Smokeless tobacco: Never Used  Substance Use Topics  . Alcohol use: Not Currently    Current Outpatient Medications:  .  amLODipine (NORVASC) 5 MG tablet, Take 1 tablet (5 mg total) by mouth daily., Disp: 90 tablet, Rfl: 1 .  aspirin EC 81 MG tablet, Take 81 mg by mouth daily., Disp: , Rfl:  .  atorvastatin (LIPITOR) 40 MG tablet, Take 1 tablet (40 mg total) by mouth daily., Disp: 90 tablet, Rfl: 1 .  Cholecalciferol (VITAMIN D3) 125 MCG (5000 UT) CAPS, Take 1 capsule by mouth daily., Disp: , Rfl:  .  DIOVAN HCT 160-25 MG tablet, Take 1 tablet by mouth daily., Disp: 90 tablet, Rfl: 1 .  Fluticasone-Salmeterol (ADVAIR DISKUS) 250-50 MCG/DOSE AEPB, Inhale 1 puff into the lungs 2 (two) times daily., Disp: 60 each, Rfl: 3 .  glucose blood  (ONETOUCH ULTRA) test strip, Check blood sugars twice a day Dx: E11.9, Disp: 100 each, Rfl: 12 .  Insulin Glargine (LANTUS SOLOSTAR) 100 UNIT/ML Solostar Pen, Inject 30 Units into the skin daily., Disp: , Rfl:  .  Insulin Pen Needle 31G X 6 MM MISC, 1 each by Does not apply route daily., Disp: 100 each, Rfl: 11 .  Lancets (ONETOUCH ULTRASOFT) lancets, Check blood sugars twice daily Dx: E11.9, Disp: 100 each, Rfl: 12 .  metFORMIN (GLUCOPHAGE) 1000 MG tablet, Take 1 tablet (1,000 mg total) by mouth every morning., Disp: 90 tablet, Rfl: 3 .  methocarbamol (ROBAXIN) 750 MG tablet, Take 1 tablet (750 mg total) by mouth 2 (two) times daily., Disp: 180 tablet, Rfl: 0  No Known Allergies  Objective:   BP 100/72   Pulse (!) 123   Temp (!) 101.4 F (38.6 C) (Oral)   Patient is well-developed, in acute respiratory distress.  Head is normocephalic, atraumatic.  Significant  labored breathing and grunting. Patient is alert and oriented at baseline.    Assessment and Plan:  1. Respiratory distress 2. Suspected COVID-19 virus infection EMS contacted and sent to patient house. Concern for ARDS secondary to COVID. Tachycardic and significant tachypneic with grunting. Unable to check O2 at home presently. Stayed on video visit with patient and family until EMS arrived.     Leeanne Rio, PA-C 10/11/2018

## 2018-10-14 MED ORDER — ACETAMINOPHEN 325 MG PO TABS
650.00 | ORAL_TABLET | ORAL | Status: DC
Start: ? — End: 2018-10-14

## 2018-10-14 MED ORDER — PNEUMOCOCCAL VAC POLYVALENT 25 MCG/0.5ML IJ INJ
0.50 | INJECTION | INTRAMUSCULAR | Status: DC
Start: ? — End: 2018-10-14

## 2018-10-14 MED ORDER — SODIUM CHLORIDE 0.45 % IV SOLN
INTRAVENOUS | Status: DC
Start: ? — End: 2018-10-14

## 2018-10-14 MED ORDER — GENERIC EXTERNAL MEDICATION
500.00 | Status: DC
Start: 2018-10-14 — End: 2018-10-14

## 2018-10-14 MED ORDER — DEXAMETHASONE SODIUM PHOSPHATE 4 MG/ML IJ SOLN
6.00 | INTRAMUSCULAR | Status: DC
Start: 2018-10-20 — End: 2018-10-14

## 2018-10-14 MED ORDER — HEPARIN SOD (PORCINE) IN D5W 100 UNIT/ML IV SOLN
30.00 | INTRAVENOUS | Status: DC
Start: ? — End: 2018-10-14

## 2018-10-14 MED ORDER — GLUCOSE 40 % PO GEL
15.00 | ORAL | Status: DC
Start: ? — End: 2018-10-14

## 2018-10-14 MED ORDER — GLUCAGON HCL RDNA (DIAGNOSTIC) 1 MG IJ SOLR
1.00 | INTRAMUSCULAR | Status: DC
Start: ? — End: 2018-10-14

## 2018-10-14 MED ORDER — HYDRALAZINE HCL 20 MG/ML IJ SOLN
5.00 | INTRAMUSCULAR | Status: DC
Start: ? — End: 2018-10-14

## 2018-10-14 MED ORDER — HEPARIN SOD (PORCINE) IN D5W 100 UNIT/ML IV SOLN
60.00 | INTRAVENOUS | Status: DC
Start: ? — End: 2018-10-14

## 2018-10-14 MED ORDER — GENERIC EXTERNAL MEDICATION
100.00 | Status: DC
Start: 2018-10-16 — End: 2018-10-14

## 2018-10-14 MED ORDER — INFLUENZA VAC HIGH-DOSE QUAD 0.7 ML IM SUSY
0.70 | PREFILLED_SYRINGE | INTRAMUSCULAR | Status: DC
Start: ? — End: 2018-10-14

## 2018-10-14 MED ORDER — BENZOCAINE-MENTHOL 6-10 MG MT LOZG
1.00 | LOZENGE | OROMUCOSAL | Status: DC
Start: ? — End: 2018-10-14

## 2018-10-14 MED ORDER — INSULIN LISPRO 100 UNIT/ML ~~LOC~~ SOLN
0.00 | SUBCUTANEOUS | Status: DC
Start: 2018-10-17 — End: 2018-10-14

## 2018-10-14 MED ORDER — ASPIRIN 81 MG PO CHEW
81.00 | CHEWABLE_TABLET | ORAL | Status: DC
Start: 2018-11-06 — End: 2018-10-14

## 2018-10-14 MED ORDER — DEXTROSE 10 % IV SOLN
125.00 | INTRAVENOUS | Status: DC
Start: ? — End: 2018-10-14

## 2018-10-14 MED ORDER — ONDANSETRON HCL 4 MG/2ML IJ SOLN
4.00 | INTRAMUSCULAR | Status: DC
Start: ? — End: 2018-10-14

## 2018-10-14 MED ORDER — FAMOTIDINE 20 MG/2ML IV SOLN
20.00 | INTRAVENOUS | Status: DC
Start: 2018-10-14 — End: 2018-10-14

## 2018-10-14 MED ORDER — GENERIC EXTERNAL MEDICATION
1.00 | Status: DC
Start: 2018-10-15 — End: 2018-10-14

## 2018-10-14 MED ORDER — FLUTICASONE FUROATE-VILANTEROL 100-25 MCG/INH IN AEPB
1.00 | INHALATION_SPRAY | RESPIRATORY_TRACT | Status: DC
Start: 2018-10-17 — End: 2018-10-14

## 2018-10-14 MED ORDER — ALBUTEROL SULFATE HFA 108 (90 BASE) MCG/ACT IN AERS
2.00 | INHALATION_SPRAY | RESPIRATORY_TRACT | Status: DC
Start: ? — End: 2018-10-14

## 2018-10-14 MED ORDER — HEPARIN SOD (PORCINE) IN D5W 100 UNIT/ML IV SOLN
12.00 | INTRAVENOUS | Status: DC
Start: ? — End: 2018-10-14

## 2018-10-15 MED ORDER — GENERIC EXTERNAL MEDICATION
0.00 | Status: DC
Start: ? — End: 2018-10-15

## 2018-10-15 MED ORDER — REFRESH LACRI-LUBE OP OINT
1.00 | TOPICAL_OINTMENT | OPHTHALMIC | Status: DC
Start: 2018-10-16 — End: 2018-10-15

## 2018-10-15 MED ORDER — GENERIC EXTERNAL MEDICATION
100.00 | Status: DC
Start: ? — End: 2018-10-15

## 2018-10-15 MED ORDER — HYDROXYZINE HCL 25 MG PO TABS
12.50 | ORAL_TABLET | ORAL | Status: DC
Start: ? — End: 2018-10-15

## 2018-10-15 MED ORDER — POLYVINYL ALCOHOL 1.4 % OP SOLN
2.00 | OPHTHALMIC | Status: DC
Start: ? — End: 2018-10-15

## 2018-10-15 MED ORDER — PROPOFOL 100 MG/10ML IV EMUL
0.00 | INTRAVENOUS | Status: DC
Start: ? — End: 2018-10-15

## 2018-10-15 MED ORDER — AMLODIPINE BESYLATE 5 MG PO TABS
5.00 | ORAL_TABLET | ORAL | Status: DC
Start: 2018-10-18 — End: 2018-10-15

## 2018-10-15 MED ORDER — REFRESH LACRI-LUBE OP OINT
1.00 | TOPICAL_OINTMENT | OPHTHALMIC | Status: DC
Start: ? — End: 2018-10-15

## 2018-10-15 MED ORDER — POLYVINYL ALCOHOL 1.4 % OP SOLN
2.00 | OPHTHALMIC | Status: DC
Start: 2018-10-16 — End: 2018-10-15

## 2018-10-15 MED ORDER — GENERIC EXTERNAL MEDICATION
200.00 | Status: DC
Start: ? — End: 2018-10-15

## 2018-10-15 MED ORDER — MELATONIN 3 MG PO TABS
6.00 | ORAL_TABLET | ORAL | Status: DC
Start: 2018-10-16 — End: 2018-10-15

## 2018-10-15 MED ORDER — FAMOTIDINE 20 MG/2ML IV SOLN
20.00 | INTRAVENOUS | Status: DC
Start: 2018-10-19 — End: 2018-10-15

## 2018-10-15 MED ORDER — SODIUM CHLORIDE 0.45 % IV SOLN
INTRAVENOUS | Status: DC
Start: ? — End: 2018-10-15

## 2018-10-16 ENCOUNTER — Telehealth: Payer: Self-pay | Admitting: Emergency Medicine

## 2018-10-16 NOTE — Telephone Encounter (Signed)
Patient's daughter calling wanting to talk to PCP about her care at the hospital.  Patient daughter is unhappy with [facility]. She wanted to transfer her to another hospital.  I advised patient that Einar Pheasant does not have any hospital privileges at any hospital.   Patient daughter wanted to discuss with PCP about ventilator concerns and questions.

## 2018-10-16 NOTE — Telephone Encounter (Signed)
I spoke with patient's daughter earlier regarding patient status in ICU at [facility]. She was wanting me to tell her if they should allow patient to be put on a ventilator or not. Discussed I could not make this decision for them but did answer questions to what a ventilator is, how it is used and pros and cons so that they could make a more educated decision along with the ICU providers. Encouraged her to reach back out to them with questions.  I see she has called again. Ok to call and see what is going on but I may not always be able to talk to here when she calls in. She will need to communicate with the treating healthcare providers regarding Mrs. Emri as I do not have any hospital privileges there.

## 2018-10-16 NOTE — Telephone Encounter (Signed)
Patients daughter called back in and wanted to speak to [place] again.

## 2018-10-16 NOTE — Telephone Encounter (Signed)
Spoke with patient daughter Mateo Flow, she wanted to make E[facility] Pheasant aware that patient had a heart attack. She advised that patient is currently on a ventilator.

## 2018-10-17 MED ORDER — GENERIC EXTERNAL MEDICATION
Status: DC
Start: ? — End: 2018-10-17

## 2018-10-17 MED ORDER — PROPOFOL 100 MG/10ML IV EMUL
0.00 | INTRAVENOUS | Status: DC
Start: ? — End: 2018-10-17

## 2018-10-17 MED ORDER — GENERIC EXTERNAL MEDICATION
10.00 | Status: DC
Start: ? — End: 2018-10-17

## 2018-10-17 MED ORDER — ALBUMIN HUMAN 25 % IV SOLN
25.00 | INTRAVENOUS | Status: DC
Start: 2018-10-17 — End: 2018-10-17

## 2018-10-17 MED ORDER — GENERIC EXTERNAL MEDICATION
0.00 | Status: DC
Start: ? — End: 2018-10-17

## 2018-10-17 MED ORDER — FENTANYL CITRATE (PF) 50 MCG/ML IJ SOLN
50.00 | INTRAMUSCULAR | Status: DC
Start: ? — End: 2018-10-17

## 2018-10-19 MED ORDER — REFRESH LACRI-LUBE OP OINT
1.00 | TOPICAL_OINTMENT | OPHTHALMIC | Status: DC
Start: 2018-11-04 — End: 2018-10-19

## 2018-10-19 MED ORDER — POLYVINYL ALCOHOL 1.4 % OP SOLN
2.00 | OPHTHALMIC | Status: DC
Start: ? — End: 2018-10-19

## 2018-10-19 MED ORDER — GENERIC EXTERNAL MEDICATION
3.00 | Status: DC
Start: ? — End: 2018-10-19

## 2018-10-19 MED ORDER — GLUCAGON HCL RDNA (DIAGNOSTIC) 1 MG IJ SOLR
1.00 | INTRAMUSCULAR | Status: DC
Start: ? — End: 2018-10-19

## 2018-10-19 MED ORDER — POLYVINYL ALCOHOL 1.4 % OP SOLN
2.00 | OPHTHALMIC | Status: DC
Start: 2018-11-04 — End: 2018-10-19

## 2018-10-19 MED ORDER — DEXTROSE 10 % IV SOLN
125.00 | INTRAVENOUS | Status: DC
Start: ? — End: 2018-10-19

## 2018-10-19 MED ORDER — INSULIN LISPRO 100 UNIT/ML ~~LOC~~ SOLN
0.00 | SUBCUTANEOUS | Status: DC
Start: 2018-11-06 — End: 2018-10-19

## 2018-10-19 MED ORDER — GENERIC EXTERNAL MEDICATION
2.25 | Status: DC
Start: 2018-10-23 — End: 2018-10-19

## 2018-10-19 MED ORDER — HYDROCORTISONE NA SUCCINATE PF 100 MG IJ SOLR
50.00 | INTRAMUSCULAR | Status: DC
Start: 2018-10-20 — End: 2018-10-19

## 2018-10-19 MED ORDER — REFRESH LACRI-LUBE OP OINT
1.00 | TOPICAL_OINTMENT | OPHTHALMIC | Status: DC
Start: ? — End: 2018-10-19

## 2018-10-19 MED ORDER — GLUCOSE 40 % PO GEL
15.00 | ORAL | Status: DC
Start: ? — End: 2018-10-19

## 2018-10-22 MED ORDER — GENERIC EXTERNAL MEDICATION
2.00 | Status: DC
Start: ? — End: 2018-10-22

## 2018-10-22 MED ORDER — PANTOPRAZOLE SODIUM 40 MG IV SOLR
40.00 | INTRAVENOUS | Status: DC
Start: 2018-10-28 — End: 2018-10-22

## 2018-10-22 MED ORDER — HYDROCORTISONE NA SUCCINATE PF 100 MG IJ SOLR
50.00 | INTRAMUSCULAR | Status: DC
Start: 2018-10-23 — End: 2018-10-22

## 2018-10-27 MED ORDER — GENERIC EXTERNAL MEDICATION
0.00 | Status: DC
Start: ? — End: 2018-10-27

## 2018-10-27 MED ORDER — SODIUM CHLORIDE 0.9 % IV SOLN
250.00 | INTRAVENOUS | Status: DC
Start: ? — End: 2018-10-27

## 2018-10-27 MED ORDER — ALBUMIN HUMAN 25 % IV SOLN
25.00 | INTRAVENOUS | Status: DC
Start: 2018-10-27 — End: 2018-10-27

## 2018-11-01 MED ORDER — HEPARIN SODIUM (PORCINE) 5000 UNIT/ML IJ SOLN
7500.00 | INTRAMUSCULAR | Status: DC
Start: 2018-11-06 — End: 2018-11-01

## 2018-11-01 MED ORDER — FLUCONAZOLE IN SODIUM CHLORIDE 200-0.9 MG/100ML-% IV SOLN
200.00 | INTRAVENOUS | Status: DC
Start: 2018-11-01 — End: 2018-11-01

## 2018-11-01 MED ORDER — GENERIC EXTERNAL MEDICATION
Status: DC
Start: ? — End: 2018-11-01

## 2018-11-01 MED ORDER — GENERIC EXTERNAL MEDICATION
0.00 | Status: DC
Start: ? — End: 2018-11-01

## 2018-11-01 MED ORDER — FAMOTIDINE 20 MG PO TABS
20.00 | ORAL_TABLET | ORAL | Status: DC
Start: 2018-11-06 — End: 2018-11-01

## 2018-11-04 MED ORDER — GENERIC EXTERNAL MEDICATION
Status: DC
Start: ? — End: 2018-11-04

## 2018-11-04 MED ORDER — GENERIC EXTERNAL MEDICATION
0.04 | Status: DC
Start: ? — End: 2018-11-04

## 2018-11-06 ENCOUNTER — Telehealth: Payer: Self-pay | Admitting: Emergency Medicine

## 2018-11-06 ENCOUNTER — Other Ambulatory Visit: Payer: Self-pay | Admitting: Physician Assistant

## 2018-11-06 MED ORDER — SODIUM CHLORIDE 0.9 % IV SOLN
250.00 | INTRAVENOUS | Status: DC
Start: ? — End: 2018-11-06

## 2018-11-06 MED ORDER — DEXTROSE 10 % IV SOLN
INTRAVENOUS | Status: DC
Start: ? — End: 2018-11-06

## 2018-11-06 NOTE — Telephone Encounter (Signed)
Patient daughter called and advised that patient passed away today November 11, 2018 at the hospital. She was admitted 10/11/18

## 2018-11-07 NOTE — Telephone Encounter (Signed)
So very sorry to hear this. Will attempt to reach patient's family tomorrow to give them a little time to process before we give condolences.

## 2018-11-10 DEATH — deceased

## 2020-05-10 ENCOUNTER — Telehealth: Payer: Self-pay

## 2020-05-10 NOTE — Telephone Encounter (Signed)
Unable to LVM advising to do TOC. 

## 2020-07-26 IMAGING — MG DIGITAL SCREENING BILATERAL MAMMOGRAM WITH TOMO AND CAD
8 series · 8 of 24 positions shown · non-contrast
Comparison: Previous exam(s).

CLINICAL DATA: Screening.

EXAM:
DIGITAL SCREENING BILATERAL MAMMOGRAM WITH TOMO AND CAD

[L CC synth-2D]
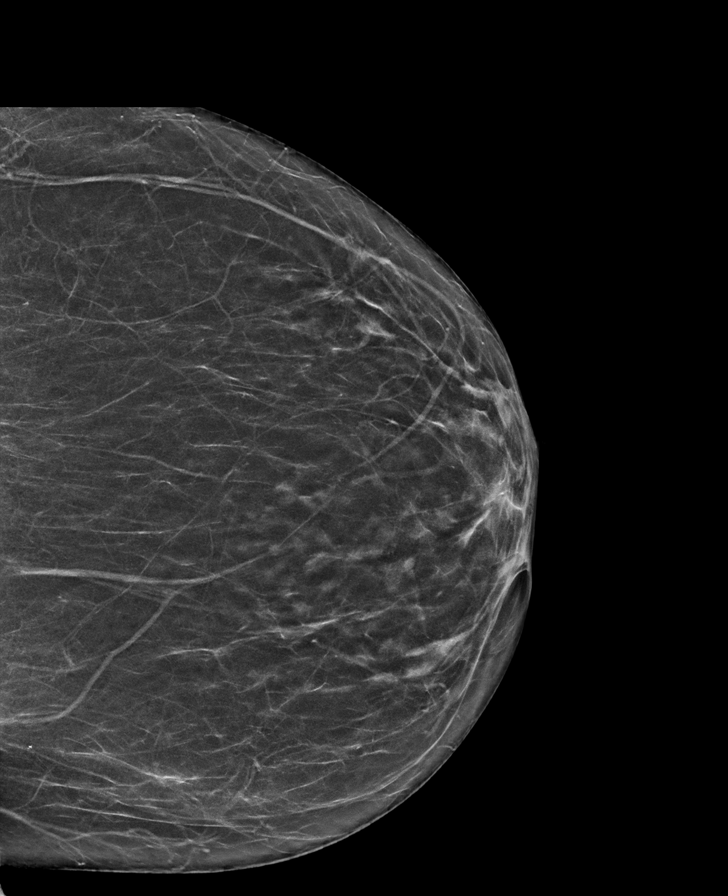

[R CC synth-2D]
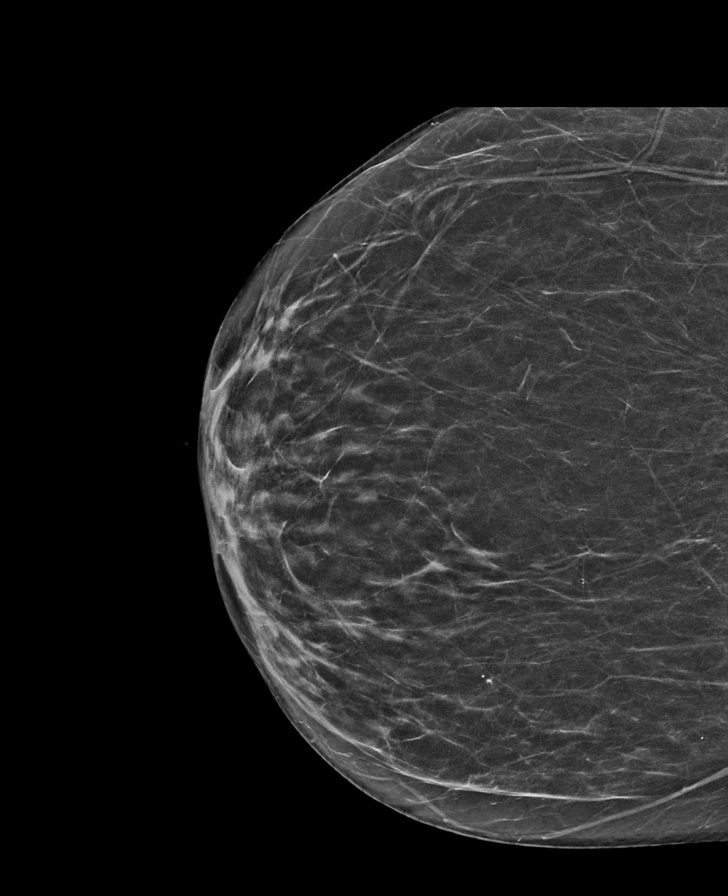

[L MLO synth-2D]
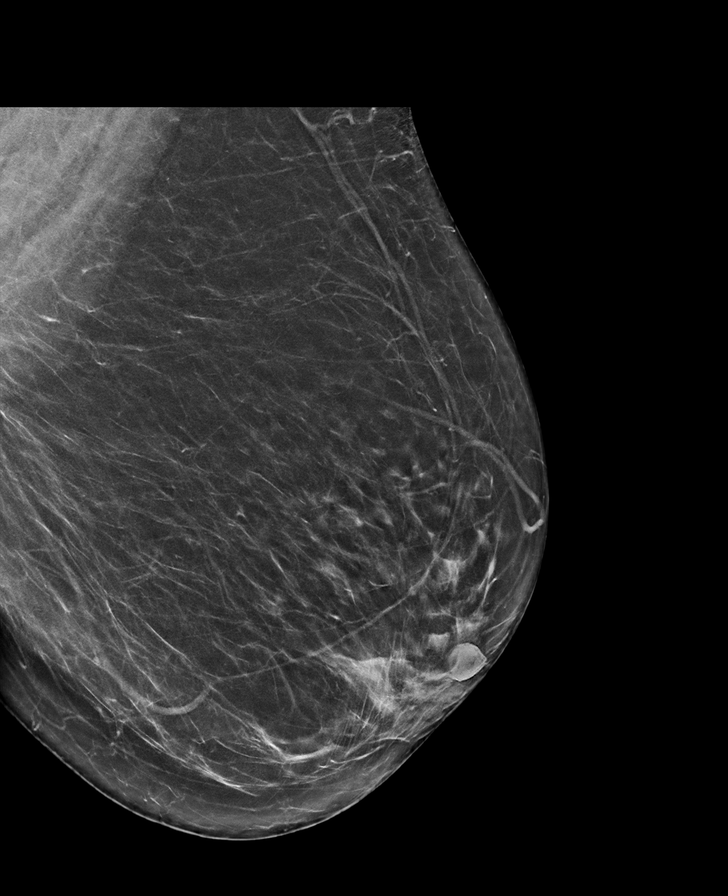

[R MLO synth-2D]
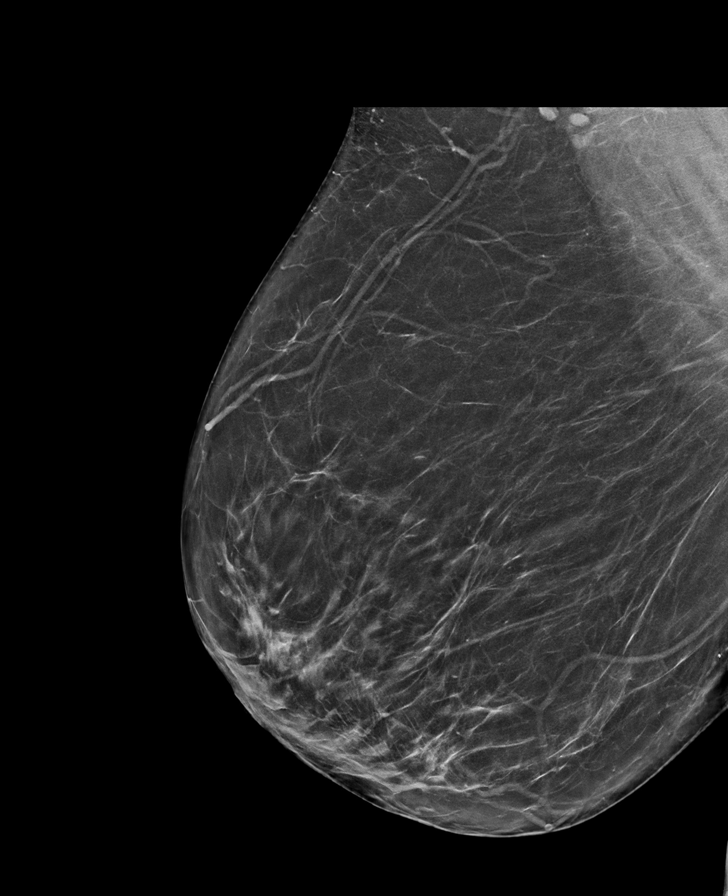

[L CC tomo · tomo slice 32/63.0]
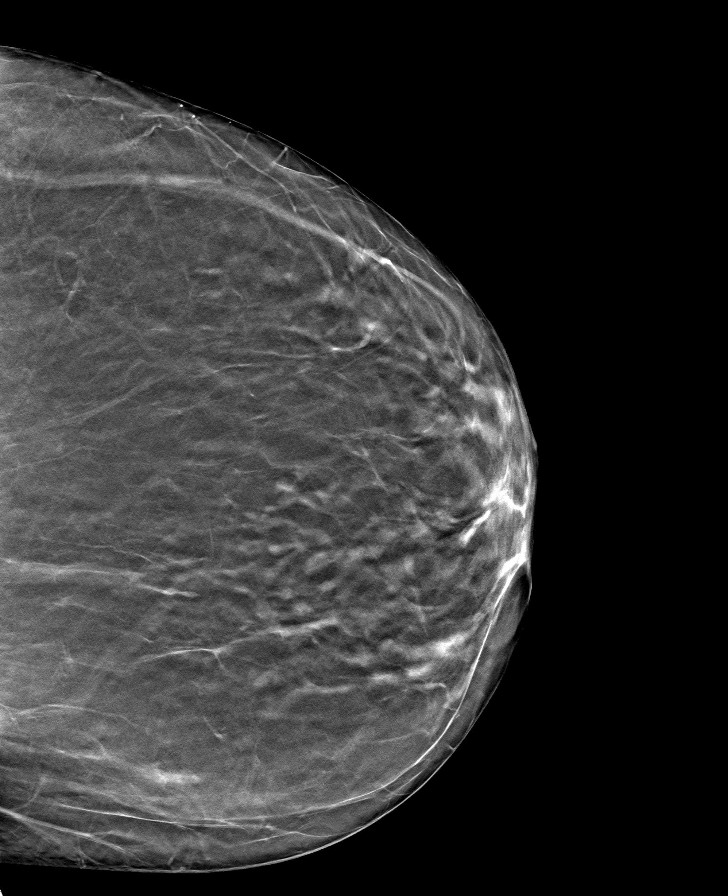

[L MLO tomo · tomo slice 39/77.0]
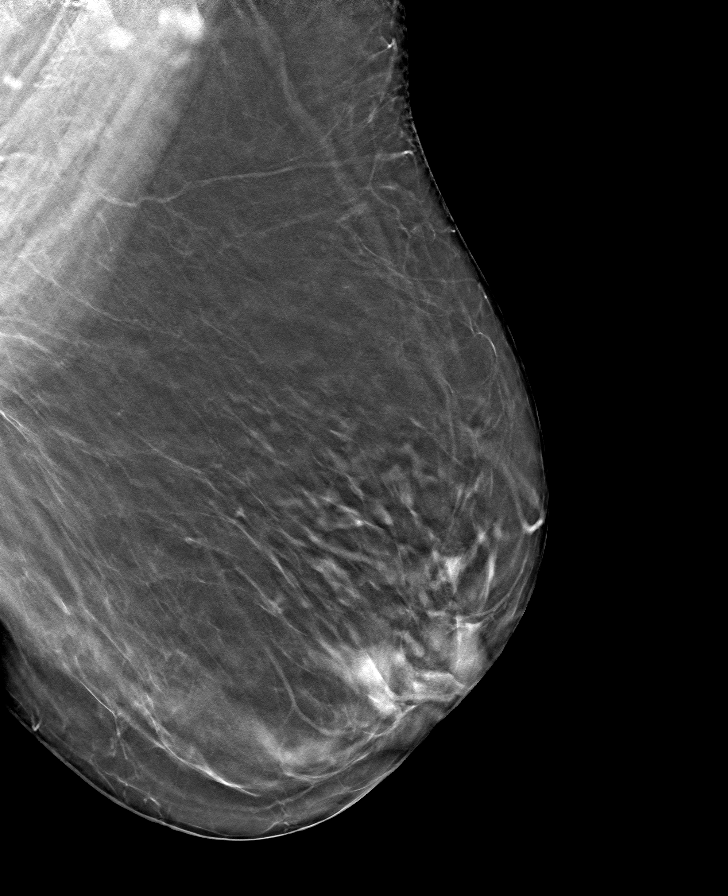

[R CC tomo · tomo slice 31/62.0]
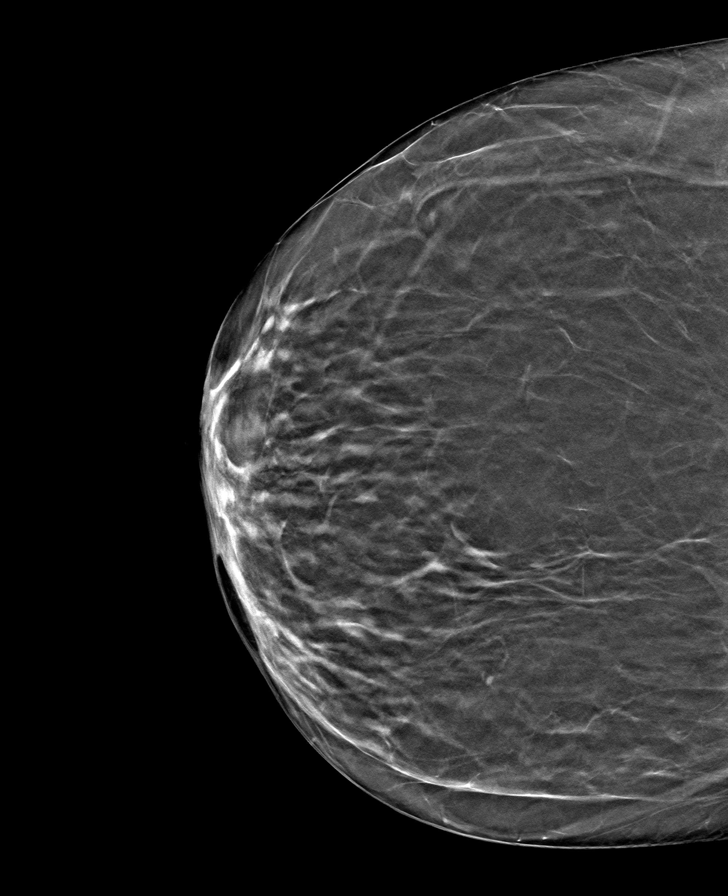

[R MLO tomo · tomo slice 40/79.0]
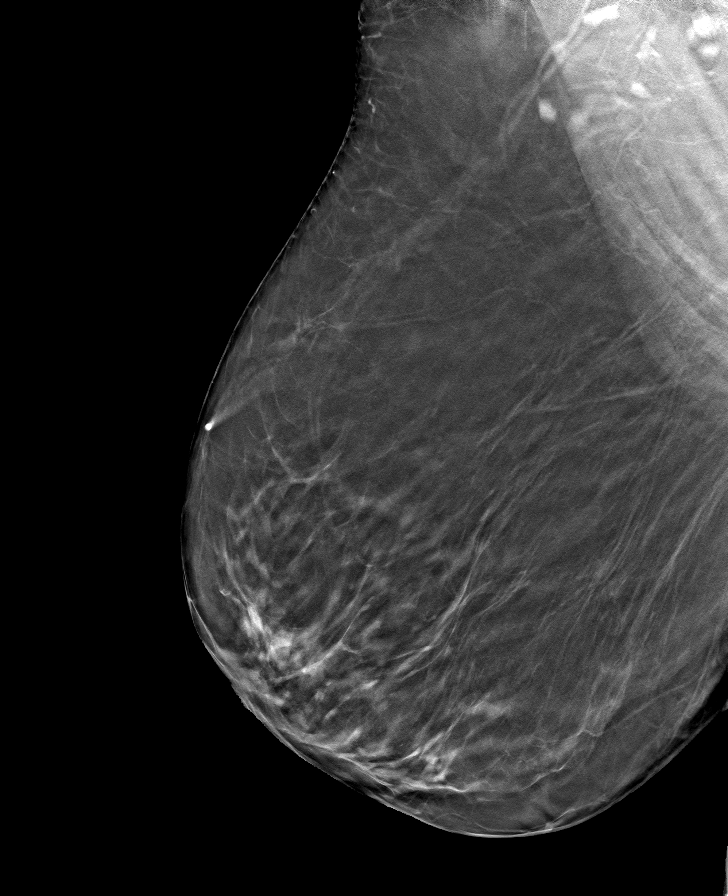

[8 of 24 positions shown; findings below may reference images not displayed]

ACR Breast Density Category b: There are scattered areas of
fibroglandular density.
FINDINGS: There are no findings suspicious for malignancy. Images were
processed with CAD.
IMPRESSION: No mammographic evidence of malignancy. A result letter of this
screening mammogram will be mailed directly to the patient.

RECOMMENDATION:
Screening mammogram in one year. (Code:[id number])

BI-RADS CATEGORY  1: Negative.
